# Patient Record
Sex: Female | Born: 1975 | Race: Black or African American | Hispanic: No | Marital: Single
Health system: Southern US, Community
[De-identification: ages and names within clinical notes are randomized; demographics above are authoritative.]

---

## 2004-03-27 ENCOUNTER — Emergency Department: Payer: Self-pay | Admitting: Emergency Medicine

## 2004-04-01 ENCOUNTER — Ambulatory Visit: Payer: Self-pay

## 2004-04-22 ENCOUNTER — Emergency Department: Payer: Self-pay | Admitting: Emergency Medicine

## 2004-04-29 ENCOUNTER — Other Ambulatory Visit: Payer: Self-pay

## 2004-04-29 ENCOUNTER — Inpatient Hospital Stay: Payer: Self-pay

## 2004-11-17 ENCOUNTER — Ambulatory Visit: Payer: Self-pay | Admitting: Obstetrics and Gynecology

## 2004-11-22 ENCOUNTER — Inpatient Hospital Stay: Payer: Self-pay | Admitting: Obstetrics and Gynecology

## 2005-07-30 ENCOUNTER — Emergency Department: Payer: Self-pay | Admitting: Emergency Medicine

## 2006-05-13 ENCOUNTER — Emergency Department: Payer: Self-pay | Admitting: Emergency Medicine

## 2006-06-11 ENCOUNTER — Inpatient Hospital Stay: Payer: Self-pay | Admitting: Internal Medicine

## 2006-06-11 ENCOUNTER — Other Ambulatory Visit: Payer: Self-pay

## 2006-07-19 ENCOUNTER — Emergency Department: Payer: Self-pay | Admitting: Emergency Medicine

## 2006-11-22 ENCOUNTER — Emergency Department: Payer: Self-pay | Admitting: Emergency Medicine

## 2006-11-22 ENCOUNTER — Other Ambulatory Visit: Payer: Self-pay

## 2006-12-30 ENCOUNTER — Encounter: Payer: Self-pay | Admitting: Nurse Practitioner

## 2007-01-24 ENCOUNTER — Encounter: Payer: Self-pay | Admitting: Nurse Practitioner

## 2007-06-29 ENCOUNTER — Ambulatory Visit (HOSPITAL_COMMUNITY): Admission: RE | Admit: 2007-06-29 | Discharge: 2007-06-29 | Payer: Self-pay | Admitting: Internal Medicine

## 2007-08-23 ENCOUNTER — Encounter (HOSPITAL_BASED_OUTPATIENT_CLINIC_OR_DEPARTMENT_OTHER): Payer: Self-pay | Admitting: Internal Medicine

## 2007-08-23 ENCOUNTER — Ambulatory Visit (HOSPITAL_COMMUNITY): Admission: RE | Admit: 2007-08-23 | Discharge: 2007-08-23 | Payer: Self-pay | Admitting: Internal Medicine

## 2008-08-09 ENCOUNTER — Encounter: Payer: Self-pay | Admitting: Nurse Practitioner

## 2008-08-29 ENCOUNTER — Encounter: Payer: Self-pay | Admitting: Nurse Practitioner

## 2008-09-22 ENCOUNTER — Encounter: Payer: Self-pay | Admitting: Nurse Practitioner

## 2008-10-23 ENCOUNTER — Encounter: Payer: Self-pay | Admitting: Nurse Practitioner

## 2008-11-07 ENCOUNTER — Inpatient Hospital Stay: Payer: Self-pay | Admitting: *Deleted

## 2008-12-11 ENCOUNTER — Inpatient Hospital Stay: Payer: Self-pay | Admitting: Internal Medicine

## 2009-01-16 ENCOUNTER — Ambulatory Visit: Payer: Self-pay | Admitting: Surgery

## 2009-01-23 ENCOUNTER — Ambulatory Visit: Payer: Self-pay | Admitting: Surgery

## 2009-02-11 ENCOUNTER — Inpatient Hospital Stay: Payer: Self-pay | Admitting: Internal Medicine

## 2009-03-14 ENCOUNTER — Ambulatory Visit: Payer: Self-pay | Admitting: Unknown Physician Specialty

## 2009-05-10 ENCOUNTER — Ambulatory Visit: Payer: Self-pay | Admitting: Vascular Surgery

## 2009-05-13 ENCOUNTER — Ambulatory Visit: Payer: Self-pay | Admitting: Vascular Surgery

## 2009-06-27 ENCOUNTER — Ambulatory Visit: Payer: Self-pay | Admitting: Pain Medicine

## 2009-07-02 ENCOUNTER — Ambulatory Visit: Payer: Self-pay | Admitting: Internal Medicine

## 2009-10-17 ENCOUNTER — Ambulatory Visit: Payer: Self-pay | Admitting: Vascular Surgery

## 2009-10-31 ENCOUNTER — Encounter: Payer: Self-pay | Admitting: Unknown Physician Specialty

## 2009-11-19 ENCOUNTER — Ambulatory Visit: Payer: Self-pay | Admitting: Surgery

## 2009-11-22 ENCOUNTER — Encounter: Payer: Self-pay | Admitting: Unknown Physician Specialty

## 2009-12-23 ENCOUNTER — Encounter: Payer: Self-pay | Admitting: Unknown Physician Specialty

## 2010-01-23 ENCOUNTER — Encounter: Payer: Self-pay | Admitting: Unknown Physician Specialty

## 2010-04-09 ENCOUNTER — Emergency Department: Payer: Self-pay | Admitting: Emergency Medicine

## 2010-04-14 ENCOUNTER — Inpatient Hospital Stay: Payer: Self-pay | Admitting: Internal Medicine

## 2010-04-21 ENCOUNTER — Emergency Department: Payer: Self-pay | Admitting: Unknown Physician Specialty

## 2010-04-30 ENCOUNTER — Inpatient Hospital Stay: Payer: Self-pay | Admitting: Internal Medicine

## 2010-10-17 ENCOUNTER — Inpatient Hospital Stay: Payer: Self-pay | Admitting: Internal Medicine

## 2011-04-08 ENCOUNTER — Inpatient Hospital Stay: Payer: Self-pay | Admitting: Internal Medicine

## 2011-06-13 ENCOUNTER — Emergency Department: Payer: Self-pay | Admitting: Emergency Medicine

## 2011-06-24 ENCOUNTER — Inpatient Hospital Stay: Payer: Self-pay | Admitting: Internal Medicine

## 2011-06-24 LAB — CBC
MCV: 95 fL (ref 80–100)
Platelet: 279 10*3/uL (ref 150–440)
RBC: 4.47 10*6/uL (ref 3.80–5.20)
WBC: 7.3 10*3/uL (ref 3.6–11.0)

## 2011-06-24 LAB — CK TOTAL AND CKMB (NOT AT ARMC)
CK-MB: 1.1 ng/mL (ref 0.5–3.6)
CK-MB: 1.9 ng/mL (ref 0.5–3.6)

## 2011-06-24 LAB — BASIC METABOLIC PANEL
Calcium, Total: 9.2 mg/dL (ref 8.5–10.1)
Chloride: 95 mmol/L — ABNORMAL LOW (ref 98–107)
Co2: 31 mmol/L (ref 21–32)
Creatinine: 7.88 mg/dL — ABNORMAL HIGH (ref 0.60–1.30)
EGFR (African American): 8 — ABNORMAL LOW
Sodium: 139 mmol/L (ref 136–145)

## 2011-06-24 LAB — PRO B NATRIURETIC PEPTIDE: B-Type Natriuretic Peptide: 394 pg/mL — ABNORMAL HIGH (ref 0–125)

## 2011-06-24 LAB — PHOSPHORUS: Phosphorus: 6 mg/dL — ABNORMAL HIGH (ref 2.5–4.9)

## 2011-06-24 LAB — TROPONIN I: Troponin-I: <0.02 ng/mL

## 2011-06-25 LAB — CBC WITH DIFFERENTIAL/PLATELET
Basophil #: 0 10*3/uL (ref 0.0–0.1)
Eosinophil #: 0.1 10*3/uL (ref 0.0–0.7)
Lymphocyte %: 29.3 %
Monocyte #: 0.6 10*3/uL (ref 0.0–0.7)
Monocyte %: 10 %
Neutrophil #: 3.8 10*3/uL (ref 1.4–6.5)
Platelet: 248 10*3/uL (ref 150–440)
RDW: 18.1 % — ABNORMAL HIGH (ref 11.5–14.5)
WBC: 6.4 10*3/uL (ref 3.6–11.0)

## 2011-06-25 LAB — LIPID PANEL
Ldl Cholesterol, Calc: 44 mg/dL (ref 0–100)
Triglycerides: 302 mg/dL — ABNORMAL HIGH (ref 0–200)

## 2011-06-25 LAB — BASIC METABOLIC PANEL
BUN: 27 mg/dL — ABNORMAL HIGH (ref 7–18)
Calcium, Total: 9.2 mg/dL (ref 8.5–10.1)
EGFR (African American): 9 — ABNORMAL LOW
EGFR (Non-African Amer.): 8 — ABNORMAL LOW
Glucose: 111 mg/dL — ABNORMAL HIGH (ref 65–99)
Osmolality: 274 (ref 275–301)
Sodium: 134 mmol/L — ABNORMAL LOW (ref 136–145)

## 2011-10-15 ENCOUNTER — Ambulatory Visit: Payer: Self-pay | Admitting: Pain Medicine

## 2012-02-15 ENCOUNTER — Emergency Department: Payer: Self-pay | Admitting: Emergency Medicine

## 2012-02-16 ENCOUNTER — Inpatient Hospital Stay: Payer: Self-pay | Admitting: Internal Medicine

## 2012-02-16 LAB — COMPREHENSIVE METABOLIC PANEL
Albumin: 3.2 g/dL — ABNORMAL LOW (ref 3.4–5.0)
Anion Gap: 11 (ref 7–16)
Bilirubin,Total: 0.3 mg/dL (ref 0.2–1.0)
Calcium, Total: 9 mg/dL (ref 8.5–10.1)
EGFR (Non-African Amer.): 3 — ABNORMAL LOW
Glucose: 185 mg/dL — ABNORMAL HIGH (ref 65–99)
Osmolality: 297 (ref 275–301)
Potassium: 5.1 mmol/L (ref 3.5–5.1)
Sodium: 136 mmol/L (ref 136–145)
Total Protein: 8.1 g/dL (ref 6.4–8.2)

## 2012-02-16 LAB — CBC WITH DIFFERENTIAL/PLATELET
Basophil %: 0.9 %
Eosinophil #: 0.2 10*3/uL (ref 0.0–0.7)
Eosinophil %: 2.6 %
Lymphocyte #: 1.9 10*3/uL (ref 1.0–3.6)
MCH: 31.2 pg (ref 26.0–34.0)
MCHC: 33.5 g/dL (ref 32.0–36.0)
MCV: 93 fL (ref 80–100)
Monocyte #: 0.5 x10 3/mm (ref 0.2–0.9)
Neutrophil %: 55.1 %
Platelet: 239 10*3/uL (ref 150–440)
RBC: 3.93 10*6/uL (ref 3.80–5.20)

## 2012-02-16 LAB — PHOSPHORUS: Phosphorus: 2.5 mg/dL (ref 2.5–4.9)

## 2012-04-02 IMAGING — CR DG CHEST 1V PORT
1 series · 1 of 1 positions shown · non-contrast
Comparison: none

REASON FOR EXAM: rales, dyspnea, renal failure
COMMENTS:

PROCEDURE:     DXR - DXR PORTABLE CHEST SINGLE VIEW  - April 14, 2010  [DATE]
RESULT:     The lungs are clear. The cardiac silhouette and visualized bony
skeleton are unremarkable. This is a markedly shallow inspiration.

[view not recorded]
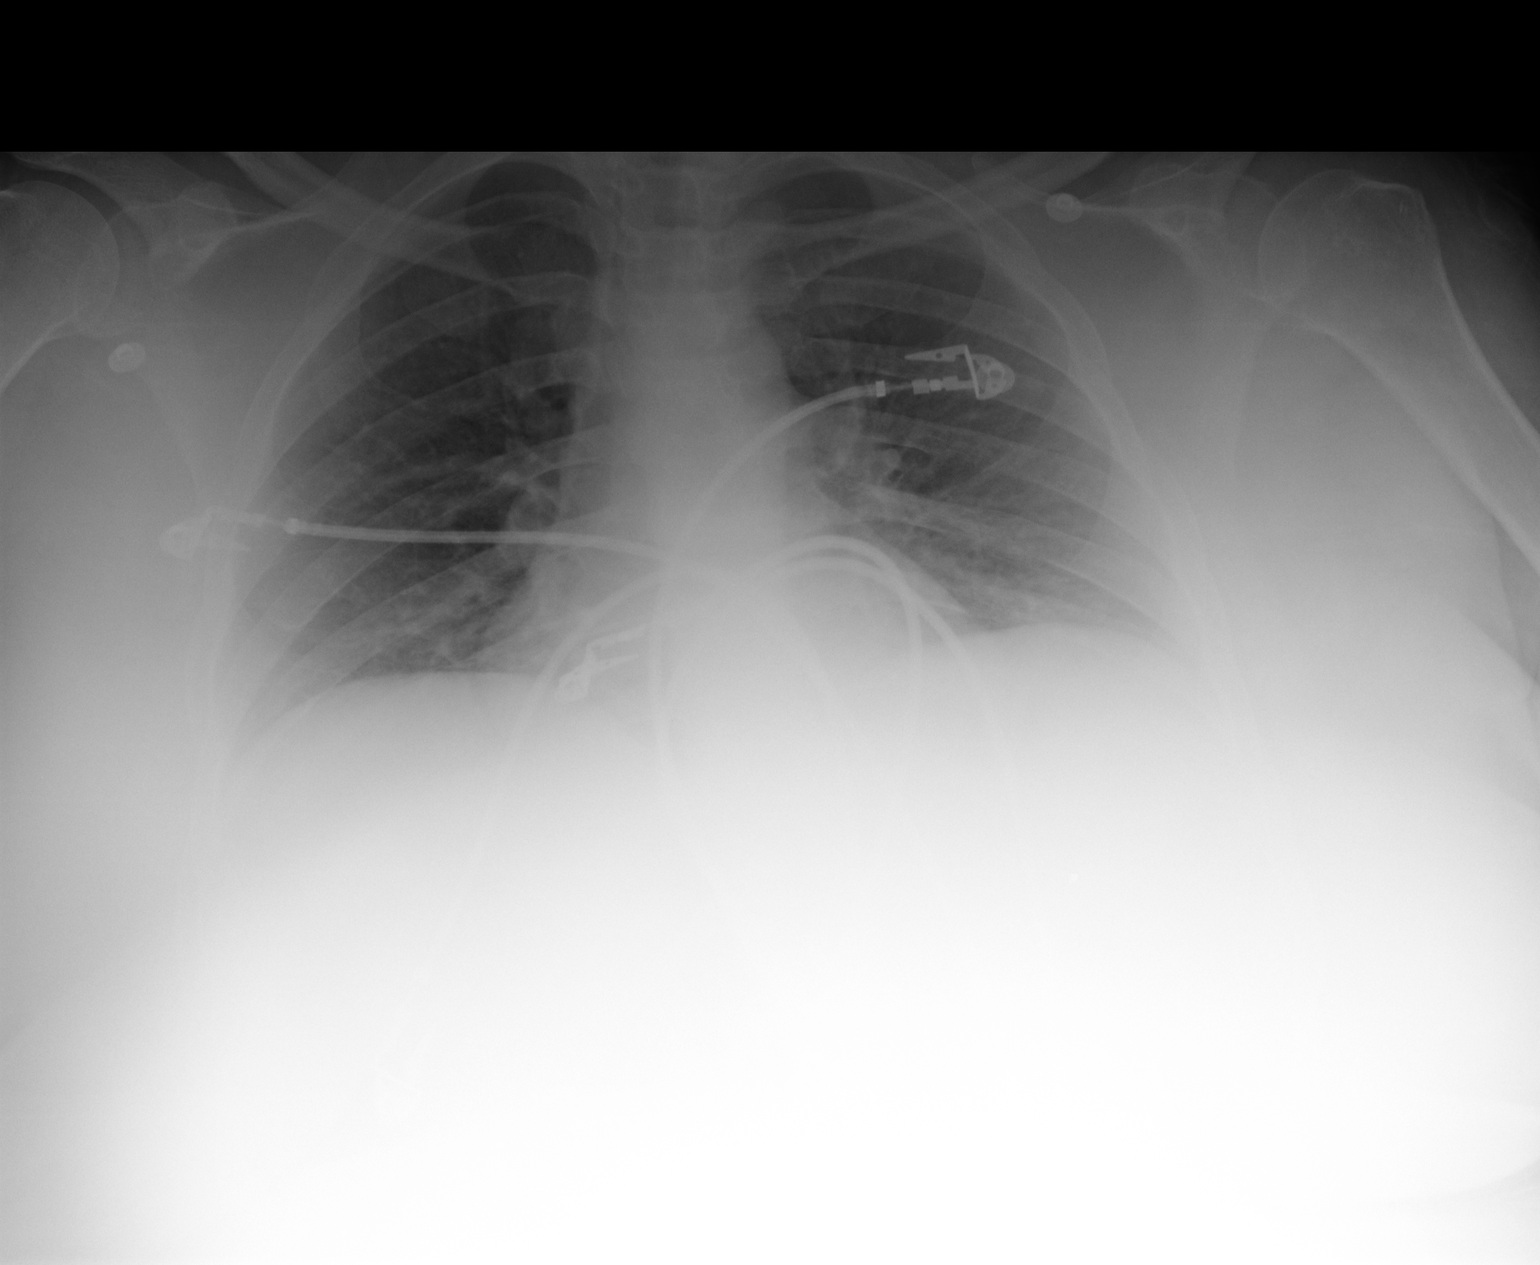

[1 of 1 positions shown; findings below may reference images not displayed]

IMPRESSION: 1. Chest radiograph without evidence of acute cardiopulmonary disease.
2. The study was compared to prior study dated 05/13/2006.

## 2012-04-03 IMAGING — US US CAROTID DUPLEX BILAT
1 series · 17 of 24 positions shown · non-contrast
Comparison: none

REASON FOR EXAM: cva
COMMENTS:

[Series 1: us carotid duplex bilat · 17 of 58 slices shown]
[im 1/58]
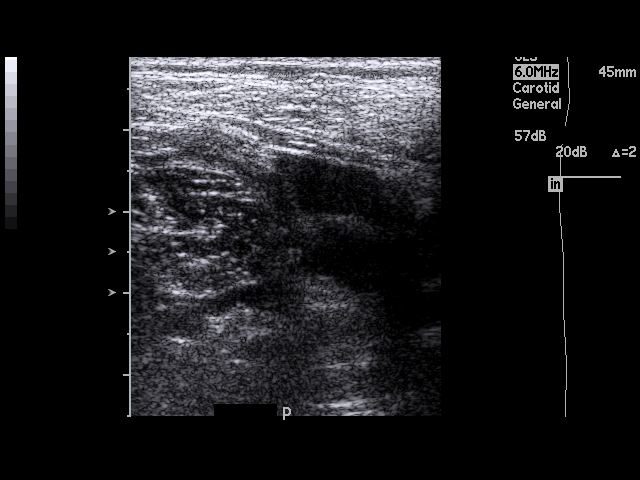
[im 5/58]
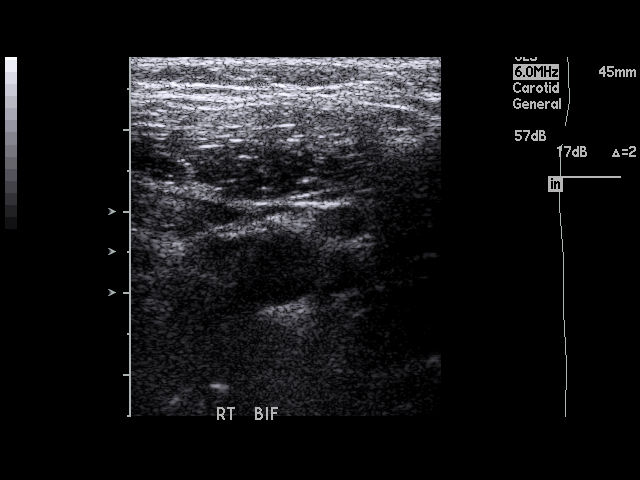
[im 8/58]
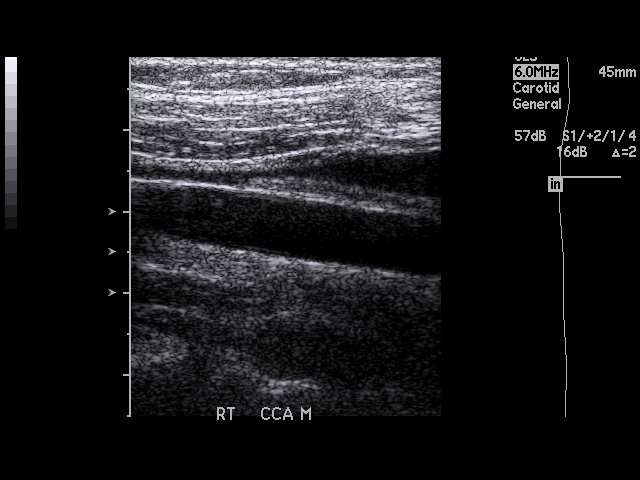
[im 10/58]
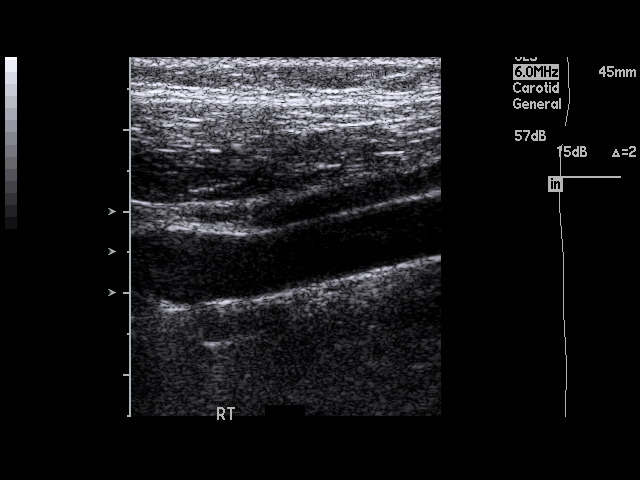
[im 15/58]
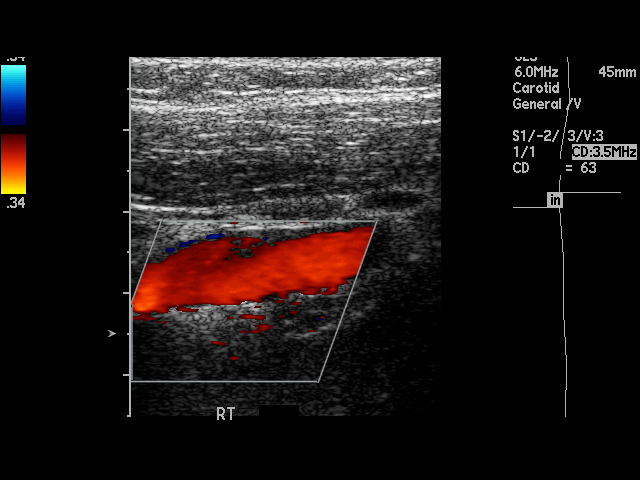
[im 18/58]
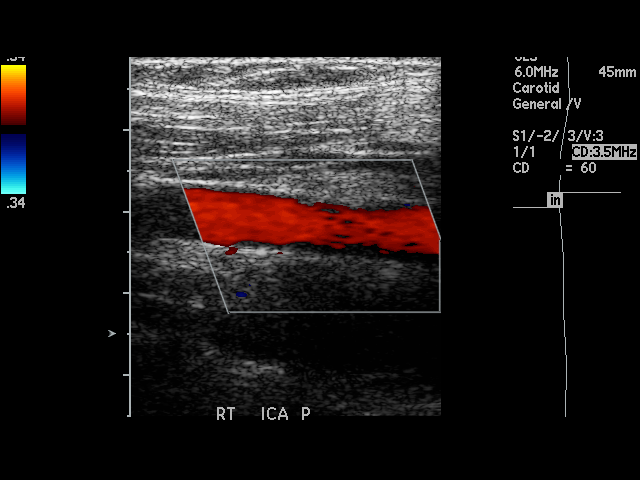
[im 23/58]
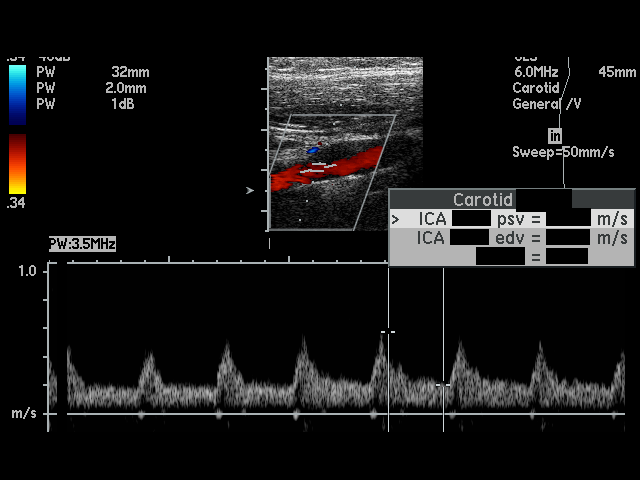
[im 25/58]
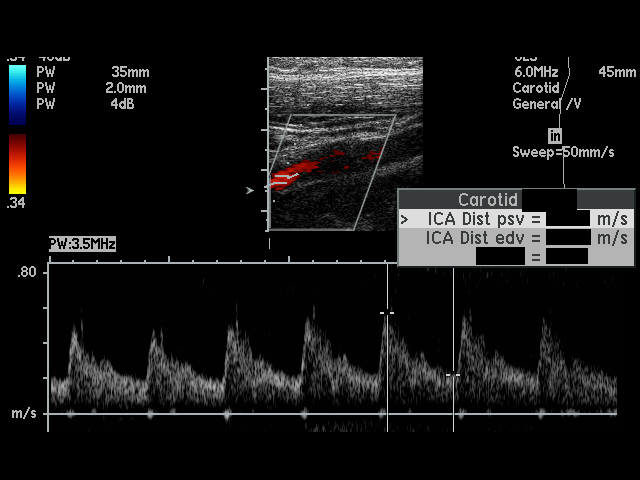
[im 30/58]
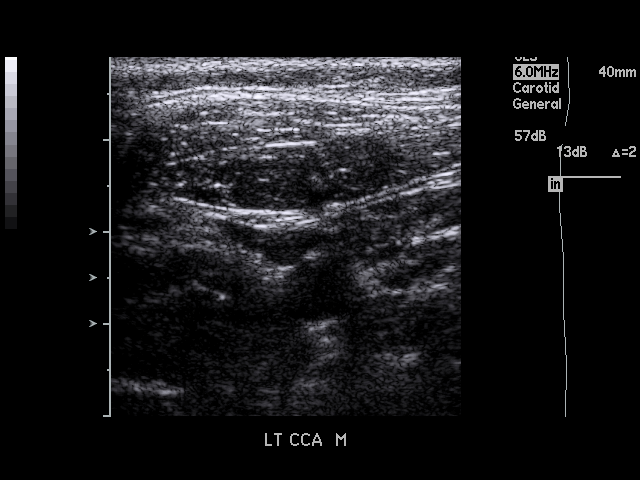
[im 33/58]
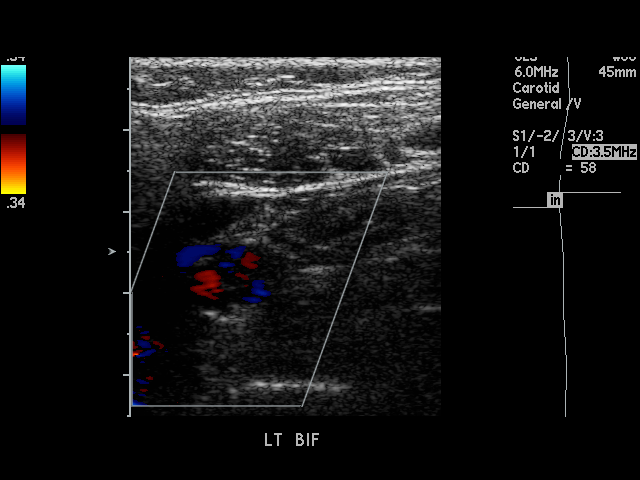
[im 35/58]
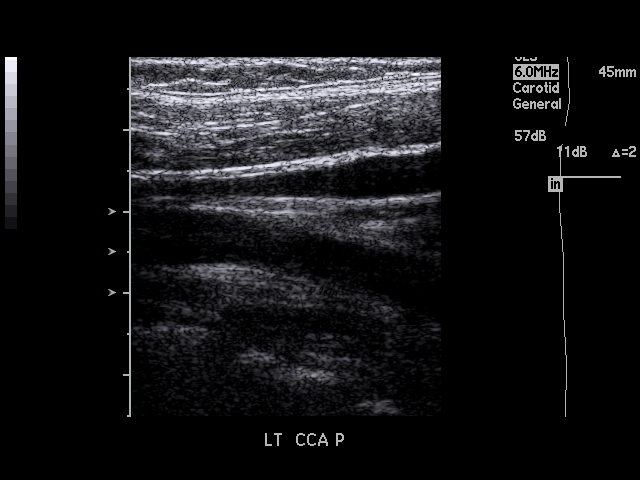
[im 40/58]
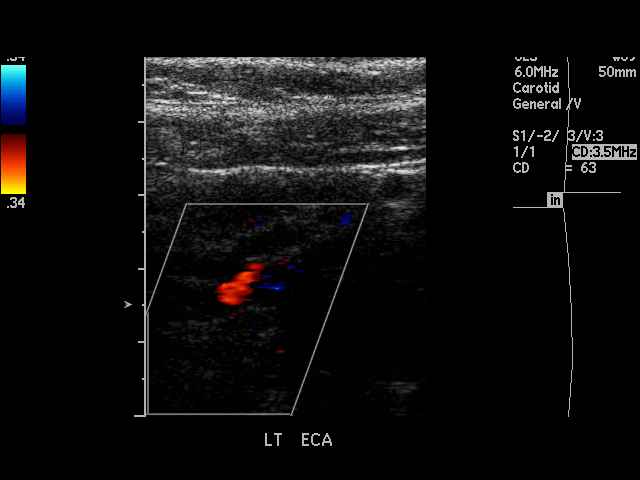
[im 43/58]
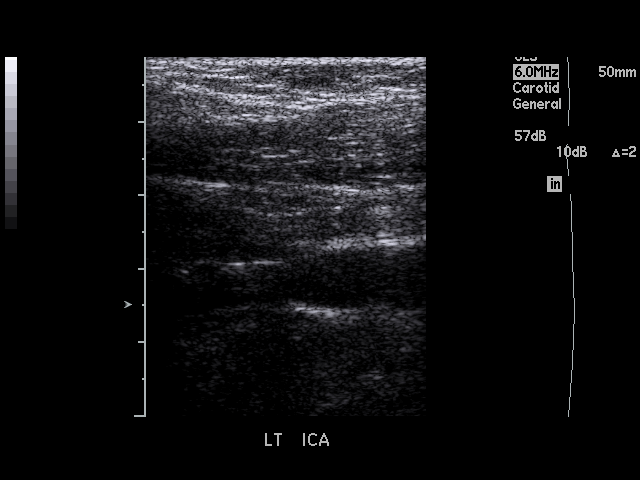
[im 48/58]
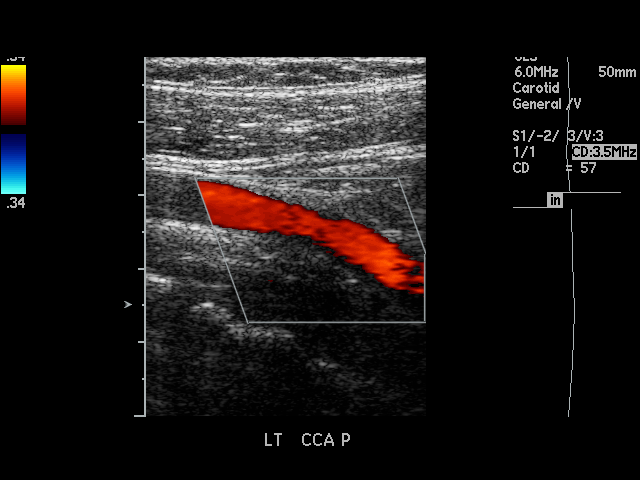
[im 50/58]
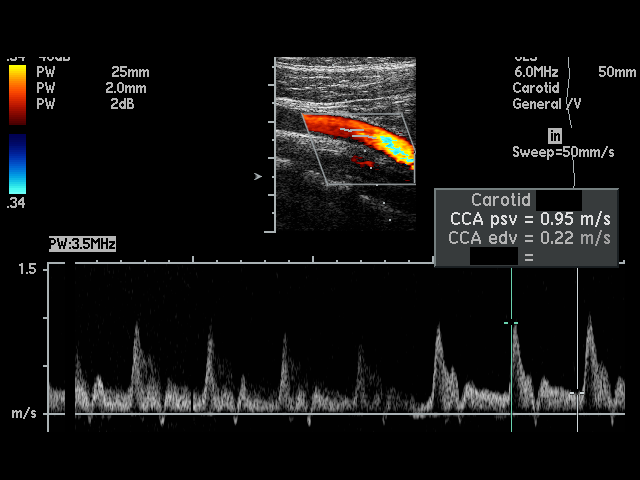
[im 53/58]
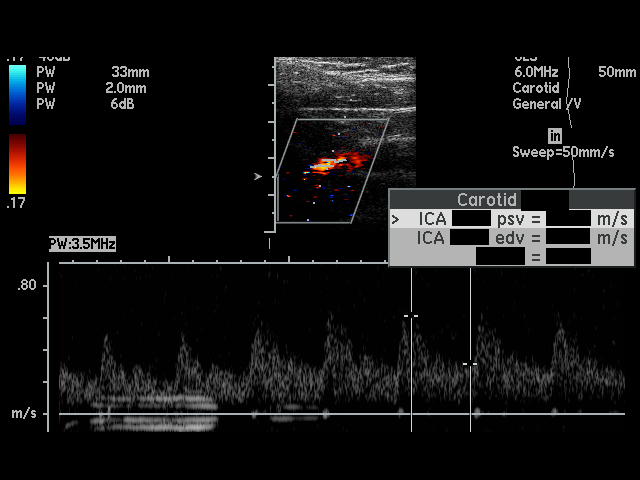
[im 58/58]
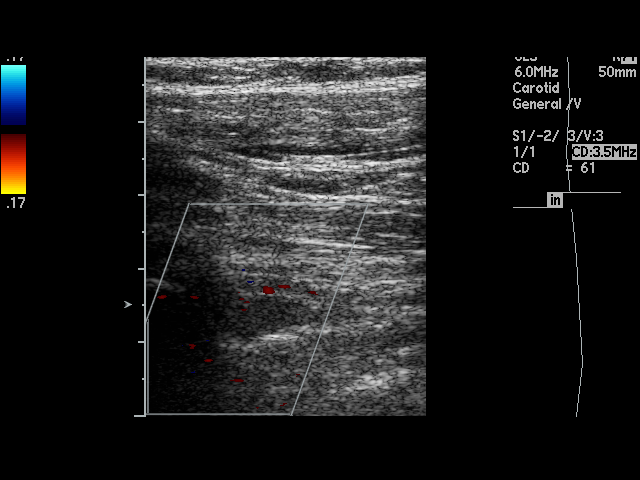

[17 of 24 positions shown; findings below may reference images not displayed]

PROCEDURE:     US  - US CAROTID DOPPLER BILATERAL  - April 15, 2010  [DATE]

RESULT:     No definite plaque formation is identified on either side. On
the right, the peak right common carotid artery flow velocity measures
meters/second and the peak right internal carotid artery flow velocity
measures 0.578 meters/second. ICA/CCA ratio is 0.781. On the left, the peak
left common carotid artery flow velocity measures 0.71 meters/second and the
peak left internal carotid artery flow velocity measures
meters/second. The ICA/CCA ratio is 0.879. These values bilaterally are
consistent with the absence of hemodynamically significant stenosis.

There is observed antegrade flow in the right vertebral. The left vertebral
is not seen and apparently is hypoplastic or occluded.
IMPRESSION: 1. No plaque formation or stenosis is identified on either side.
2. There is antegrade flow in the right vertebral.
3. The left vertebral is not seen.

## 2012-04-03 IMAGING — CR DG ABDOMEN 3V
1 series · 5 of 5 positions shown · non-contrast
Comparison: none

REASON FOR EXAM: abd discomfort
COMMENTS:

[Series 1: view not recorded · 0.17mm/px · 5 of 5 slices shown]
[im 1/5]
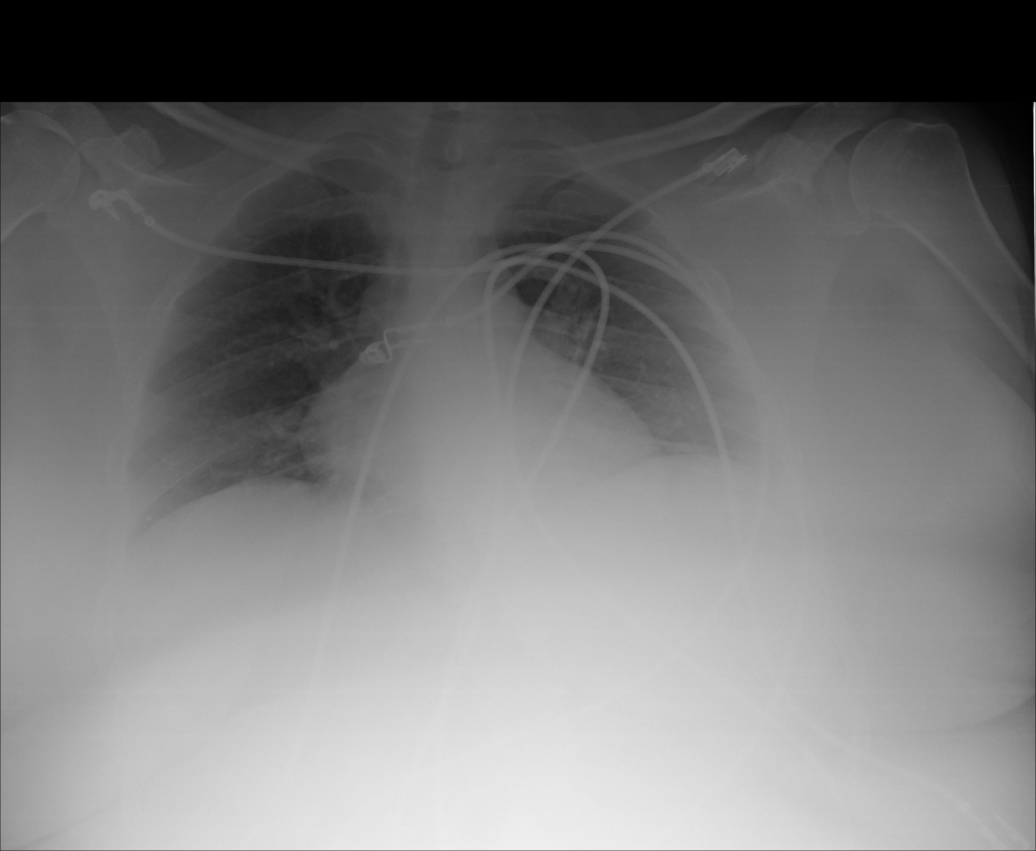
[im 2/5]
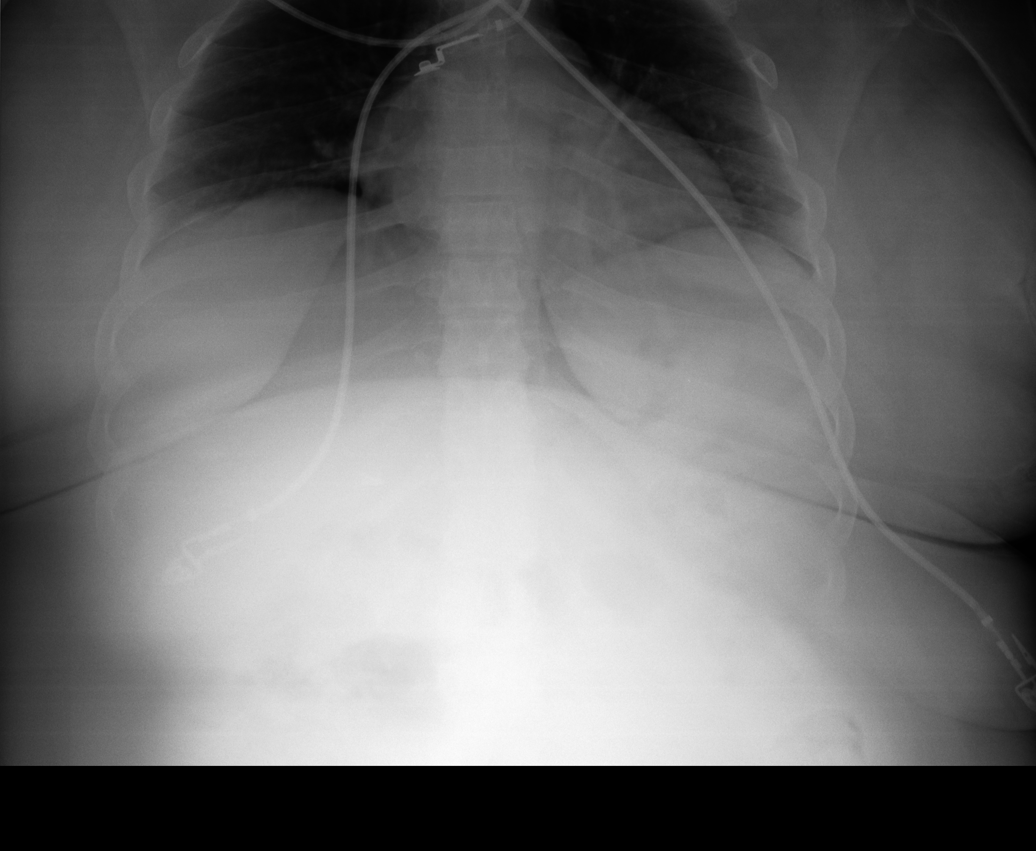
[im 3/5]
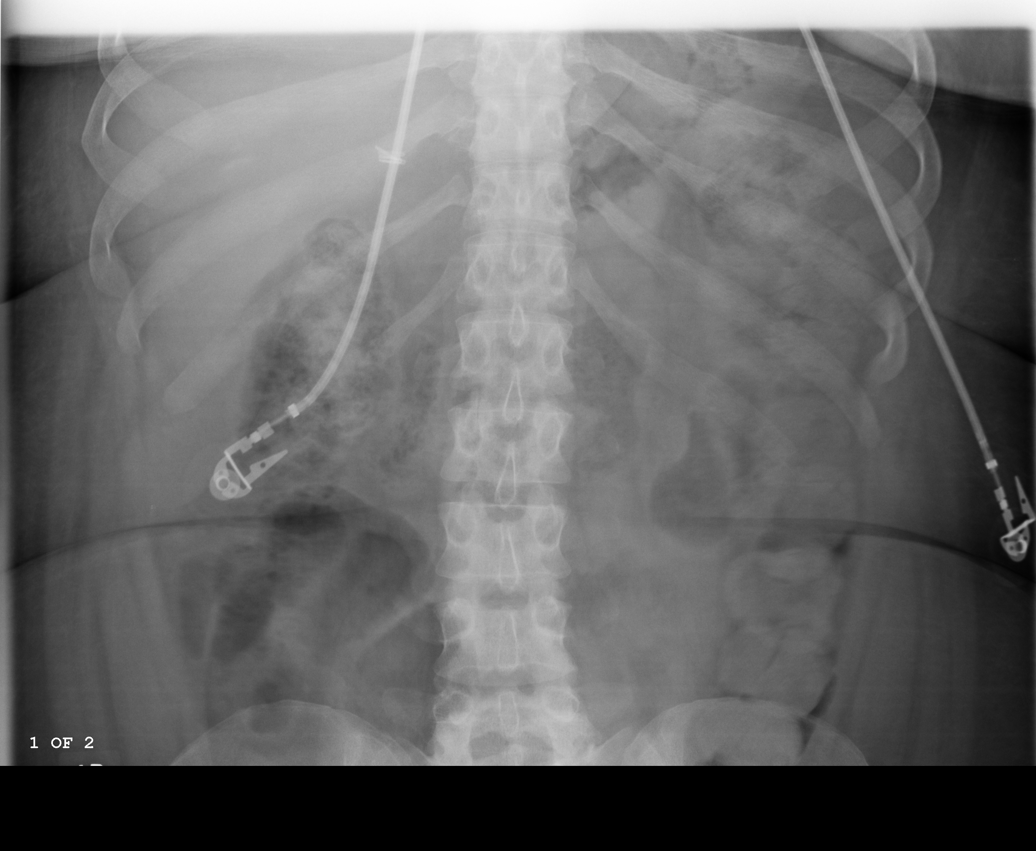
[im 4/5]
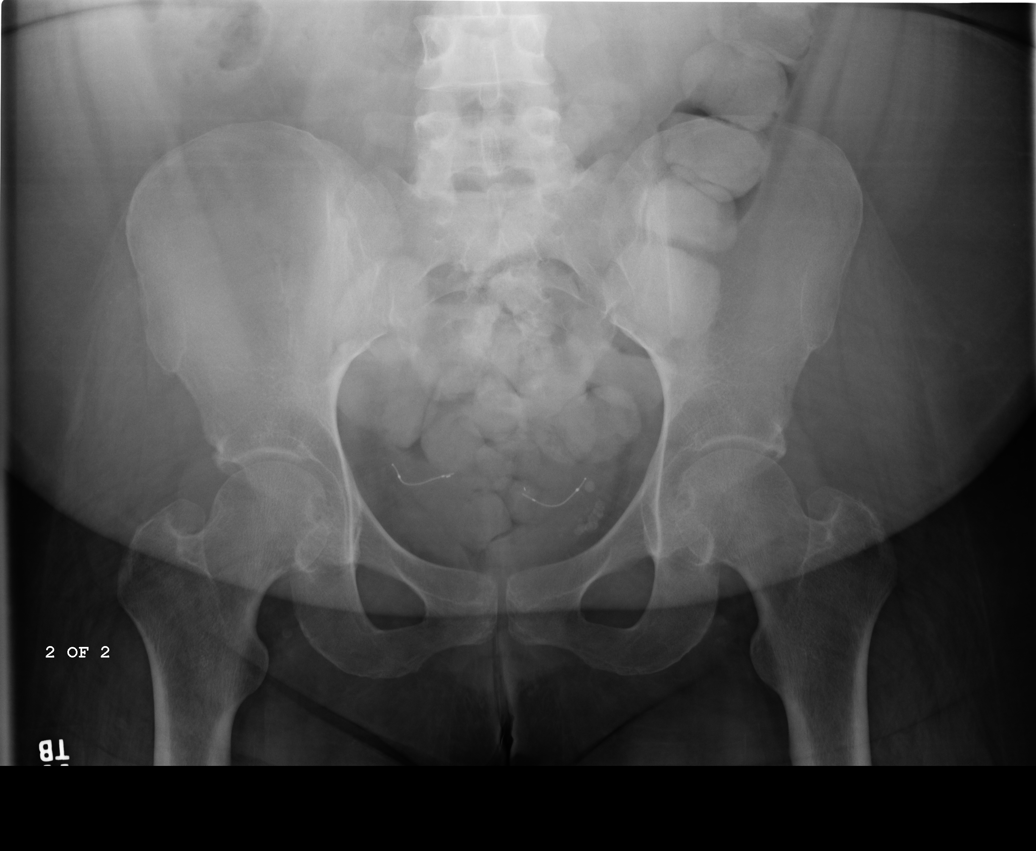
[im 5/5]
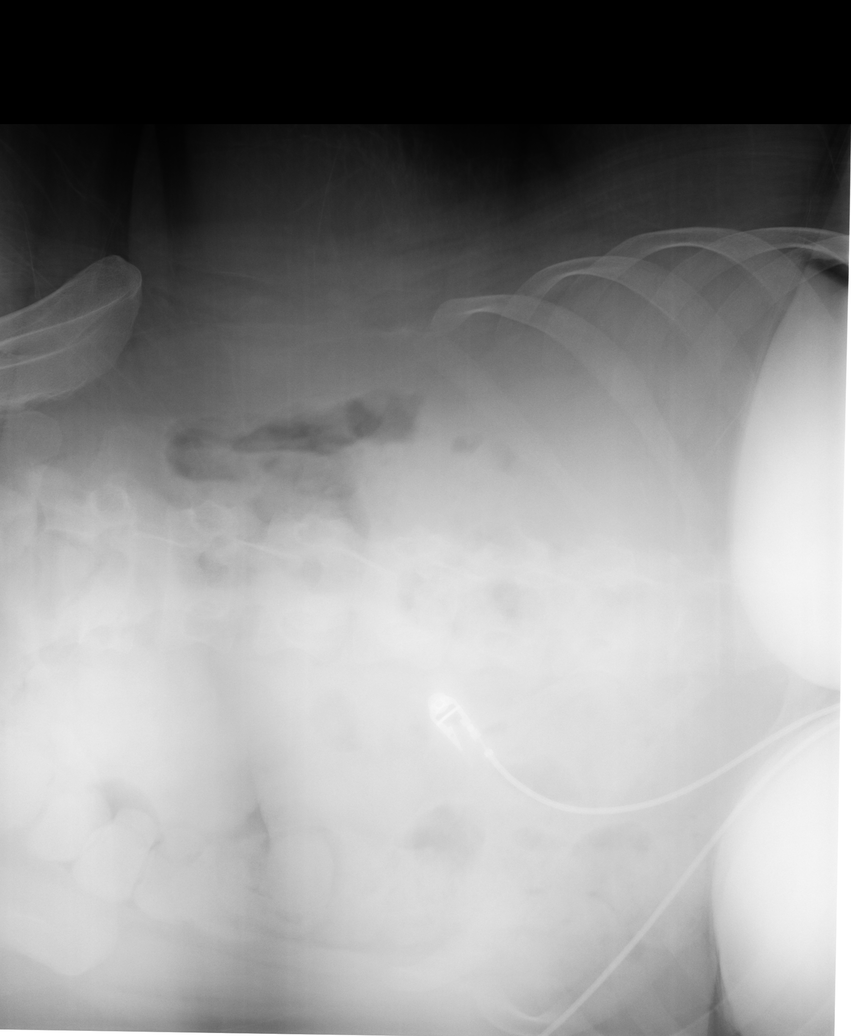

[5 of 5 positions shown; findings below may reference images not displayed]

PROCEDURE:     DXR - DXR ABDOMEN 3-WAY (INCL PA CXR)  - April 15, 2010  [DATE]

RESULT:

Comparison is made to the prior exam of 02/21/2009.

The lung fields are clear of infiltrate.

Multiple views of the abdomen were obtained. No subdiaphragmatic free air is
seen. The bowel gas pattern shows no specific abnormalities. There is no
evidence for bowel obstruction. There is a moderate amount of fecal material
in the colon. A small amount of contrast material is observed in the colon,
likely secondary to prior CT or prior GI studies. Post operative metallic
clips are noted in the region of the gallbladder bed. No acute bony
abnormalities are seen.
IMPRESSION: No acute changes are identified.

## 2012-04-09 IMAGING — CT CT ABD-PELV W/O CM
1 of 2 series · 15 of 32 positions shown, 19 images · non-contrast
Comparison: none

REASON FOR EXAM: (1) llq pain; (2) llq pain
COMMENTS:   May transport without cardiac monitor

[Series 3: stone · axial · 0.86mm/px · z∈[+258,+710]mm · 15 of 165 slices shown, 19 images]
[im 7/165  soft-tissue]
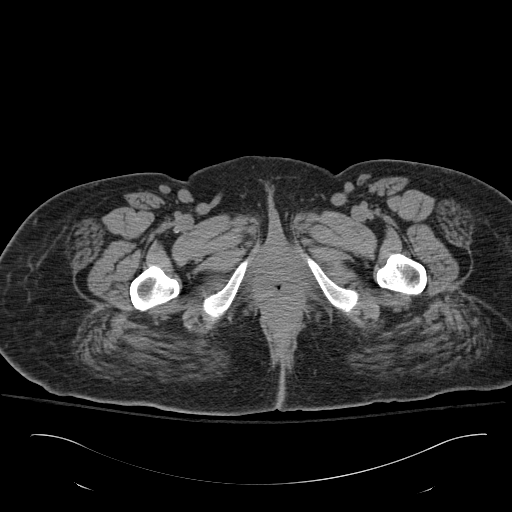
[im 7/165  bone]
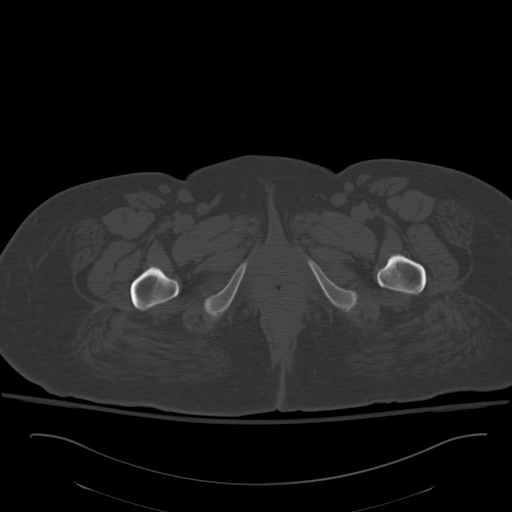
[im 19/165  soft-tissue]
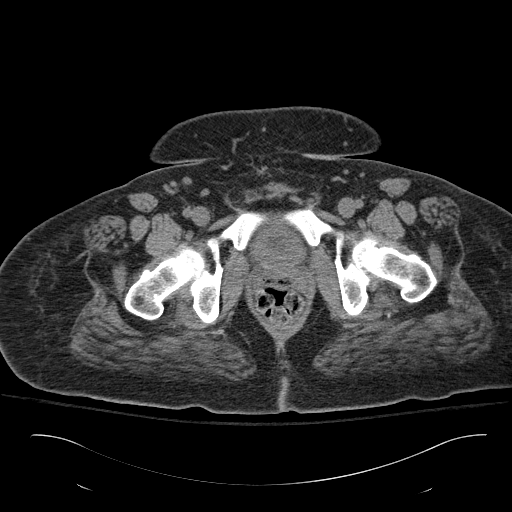
[im 32/165  soft-tissue]
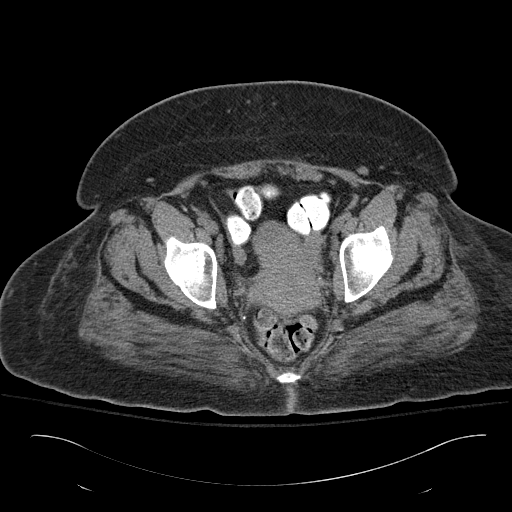
[im 45/165  soft-tissue]
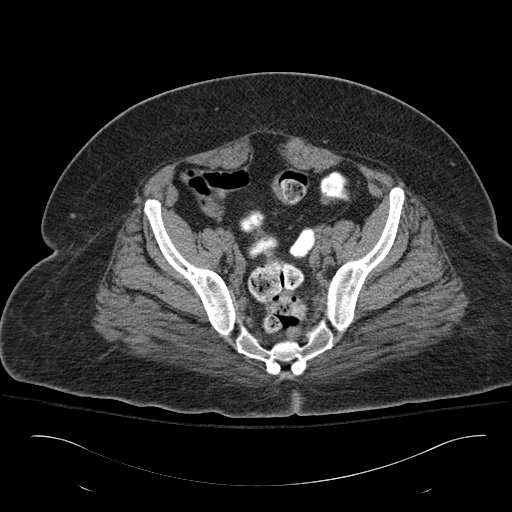
[im 57/165  soft-tissue]
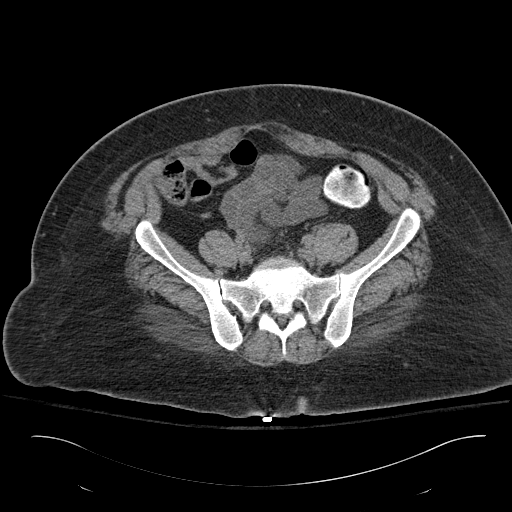
[im 70/165  soft-tissue]
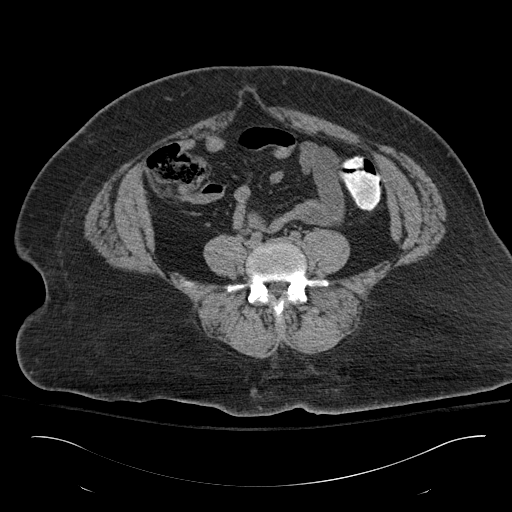
[im 83/165  soft-tissue]
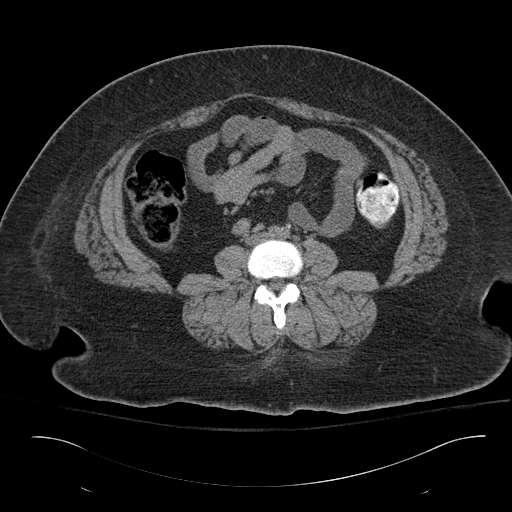
[im 95/165  soft-tissue]
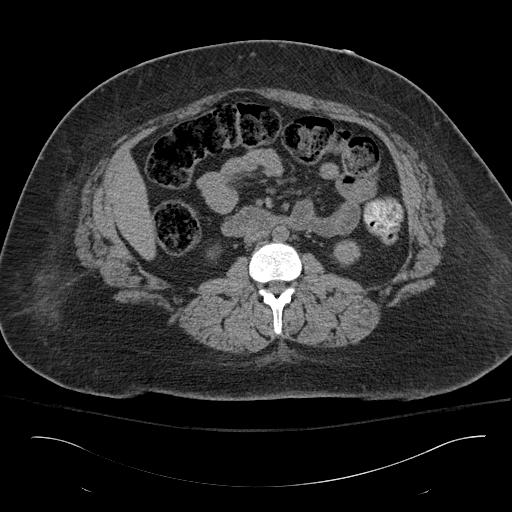
[im 108/165  soft-tissue]
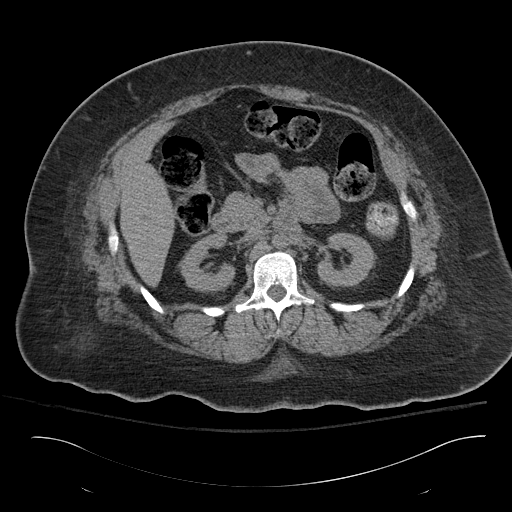
[im 108/165  bone]
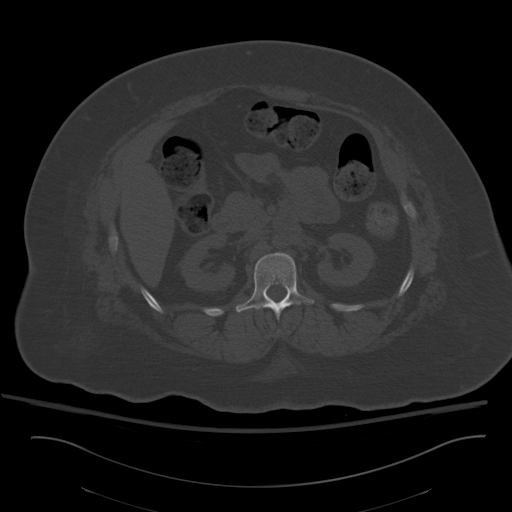
[im 120/165  soft-tissue]
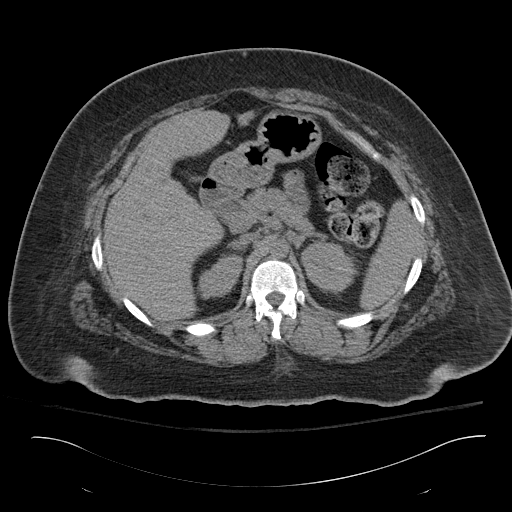
[im 133/165  soft-tissue]
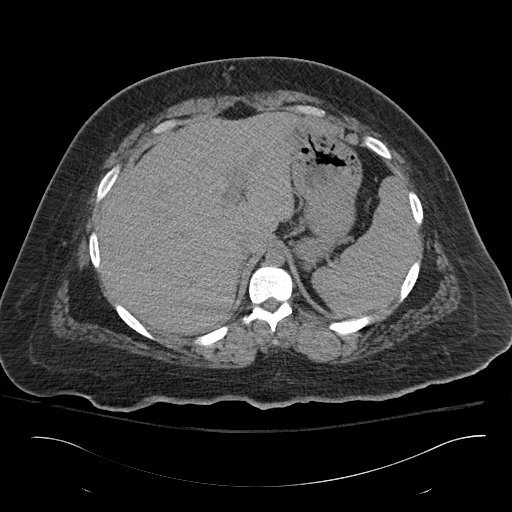
[im 139/165  lung]
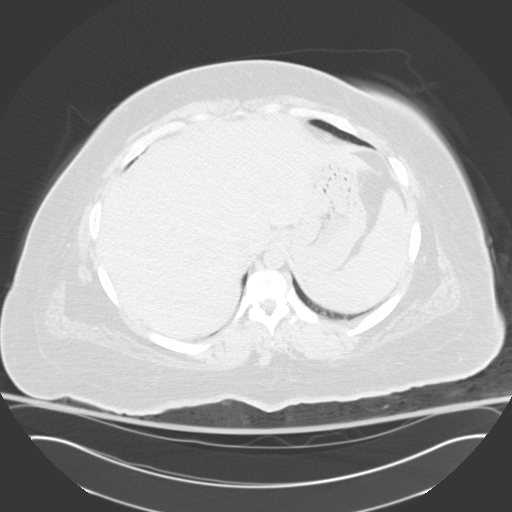
[im 146/165  soft-tissue]
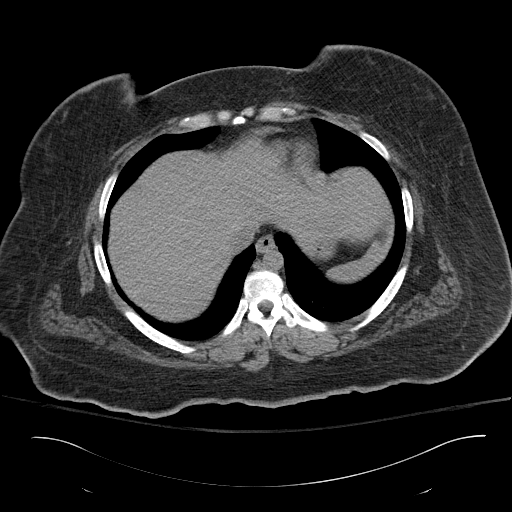
[im 146/165  lung]
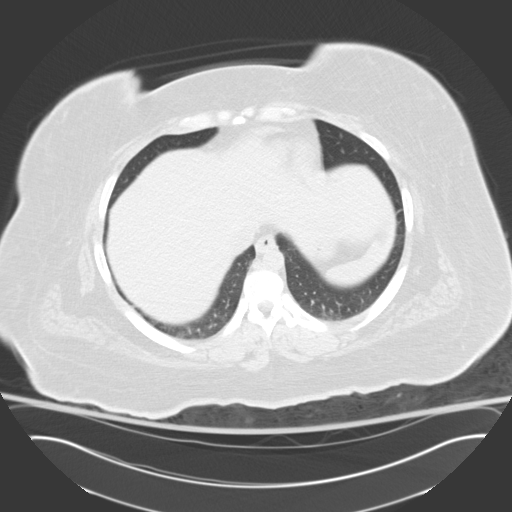
[im 152/165  lung]
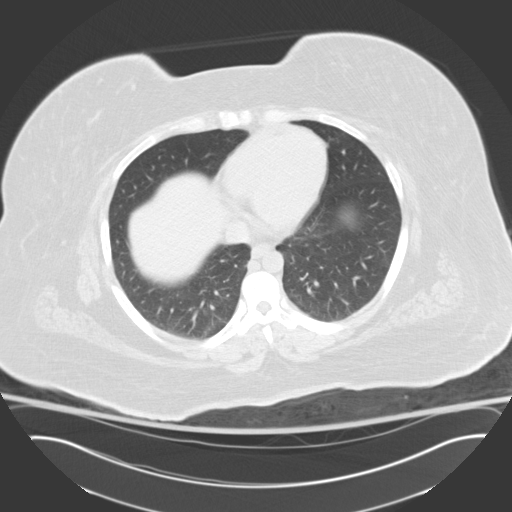
[im 158/165  soft-tissue]
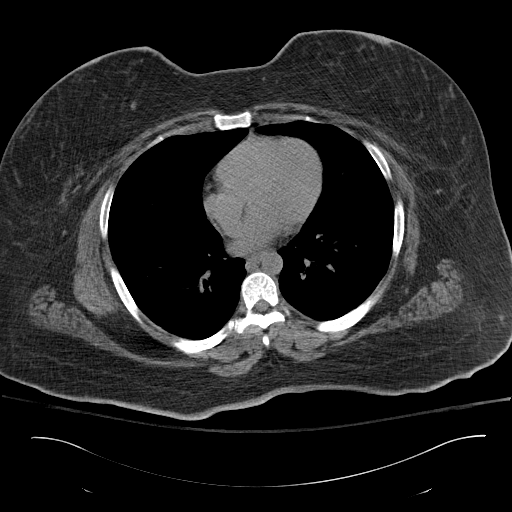
[im 158/165  lung]
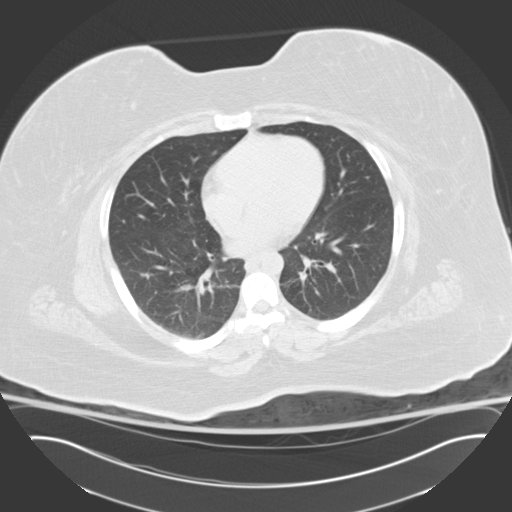

[15 of 32 positions shown; findings below may reference images not displayed]

PROCEDURE:     CT  - CT ABDOMEN AND PELVIS W[DATE]  [DATE]

RESULT:     Noncontrast CT of the abdomen and pelvis is reconstructed at 3
mm slice thickness in the axial plane and compared to the most recent study
of 12/11/2008.

The lung bases appear clear. There is no pleural or pericardial effusion.
There is no hydronephrosis or hydroureter. No radiopaque calculi are
evident. A cholecystectomy clips are present. Noncontrast images of the
liver, spleen, pancreas, kidneys, adrenal glands and abdominal aorta are
unremarkable. There is no evidence of abnormal bowel distention or bowel
wall thickening. The appendix appears unremarkable. The uterus is present.
Linear radiodensities project in the region of the uterine cornu and
fallopian tubes possibly secondary to obstructive devices. The uterus
appears unremarkable. The urinary bladder is nondistended. There is no
ascites. A small fat filled umbilical hernia is present. No abscess is
identified. Nonenlarged inguinal lymph nodes are present.
IMPRESSION: No acute abnormality evident.

## 2012-10-10 ENCOUNTER — Emergency Department: Payer: Self-pay | Admitting: Emergency Medicine

## 2012-10-10 LAB — CBC
MCH: 31.3 pg (ref 26.0–34.0)
MCV: 93 fL (ref 80–100)
WBC: 5.9 10*3/uL (ref 3.6–11.0)

## 2012-10-11 ENCOUNTER — Inpatient Hospital Stay: Payer: Self-pay | Admitting: Internal Medicine

## 2012-10-11 LAB — COMPREHENSIVE METABOLIC PANEL
BUN: 41 mg/dL — ABNORMAL HIGH (ref 7–18)
Bilirubin,Total: 0.2 mg/dL (ref 0.2–1.0)
Calcium, Total: 7.2 mg/dL — ABNORMAL LOW (ref 8.5–10.1)
Creatinine: 8.95 mg/dL — ABNORMAL HIGH (ref 0.60–1.30)
EGFR (Non-African Amer.): 5 — ABNORMAL LOW
Osmolality: 288 (ref 275–301)
SGOT(AST): 12 U/L — ABNORMAL LOW (ref 15–37)
Sodium: 132 mmol/L — ABNORMAL LOW (ref 136–145)
Total Protein: 7.5 g/dL (ref 6.4–8.2)

## 2012-10-11 LAB — CBC WITH DIFFERENTIAL/PLATELET
Basophil %: 0.8 %
Eosinophil #: 0.1 10*3/uL (ref 0.0–0.7)
HCT: 22.4 % — ABNORMAL LOW (ref 35.0–47.0)
Lymphocyte #: 1.2 10*3/uL (ref 1.0–3.6)
Lymphocyte %: 19.1 %
Monocyte #: 0.5 x10 3/mm (ref 0.2–0.9)
RDW: 16.3 % — ABNORMAL HIGH (ref 11.5–14.5)
WBC: 6.1 10*3/uL (ref 3.6–11.0)

## 2012-10-11 LAB — TROPONIN I: Troponin-I: <0.02 ng/mL

## 2012-10-12 LAB — CBC WITH DIFFERENTIAL/PLATELET
Eosinophil #: 0.1 10*3/uL (ref 0.0–0.7)
Eosinophil %: 1.8 %
HCT: 23.5 % — ABNORMAL LOW (ref 35.0–47.0)
HGB: 8 g/dL — ABNORMAL LOW (ref 12.0–16.0)
Lymphocyte %: 27.1 %
MCHC: 34 g/dL (ref 32.0–36.0)
MCV: 89 fL (ref 80–100)
Monocyte #: 0.5 x10 3/mm (ref 0.2–0.9)
Neutrophil #: 4.1 10*3/uL (ref 1.4–6.5)
Platelet: 211 10*3/uL (ref 150–440)
RBC: 2.64 10*6/uL — ABNORMAL LOW (ref 3.80–5.20)
RDW: 17.3 % — ABNORMAL HIGH (ref 11.5–14.5)

## 2012-10-12 LAB — BASIC METABOLIC PANEL
Co2: 28 mmol/L (ref 21–32)
EGFR (Non-African Amer.): 4 — ABNORMAL LOW
Potassium: 4.5 mmol/L (ref 3.5–5.1)

## 2012-10-12 LAB — PHOSPHORUS: Phosphorus: 3.6 mg/dL (ref 2.5–4.9)

## 2012-10-12 LAB — TSH: Thyroid Stimulating Horm: 1.4 u[IU]/mL

## 2012-10-15 ENCOUNTER — Inpatient Hospital Stay: Payer: Self-pay | Admitting: Internal Medicine

## 2012-10-15 LAB — COMPREHENSIVE METABOLIC PANEL
Albumin: 3.3 g/dL — ABNORMAL LOW (ref 3.4–5.0)
Anion Gap: 6 — ABNORMAL LOW (ref 7–16)
BUN: 21 mg/dL — ABNORMAL HIGH (ref 7–18)
Bilirubin,Total: 0.3 mg/dL (ref 0.2–1.0)
Calcium, Total: 7.7 mg/dL — ABNORMAL LOW (ref 8.5–10.1)
Chloride: 95 mmol/L — ABNORMAL LOW (ref 98–107)
Creatinine: 5.69 mg/dL — ABNORMAL HIGH (ref 0.60–1.30)
EGFR (Non-African Amer.): 9 — ABNORMAL LOW
Glucose: 221 mg/dL — ABNORMAL HIGH (ref 65–99)
Potassium: 3.8 mmol/L (ref 3.5–5.1)
SGOT(AST): 15 U/L (ref 15–37)
Sodium: 135 mmol/L — ABNORMAL LOW (ref 136–145)
Total Protein: 8.1 g/dL (ref 6.4–8.2)

## 2012-10-15 LAB — CBC
HCT: 18.5 % — ABNORMAL LOW (ref 35.0–47.0)
HGB: 6.4 g/dL — ABNORMAL LOW (ref 12.0–16.0)
MCH: 31.7 pg (ref 26.0–34.0)
MCV: 92 fL (ref 80–100)
RBC: 2.02 10*6/uL — ABNORMAL LOW (ref 3.80–5.20)
RDW: 17.8 % — ABNORMAL HIGH (ref 11.5–14.5)

## 2012-10-15 LAB — CK TOTAL AND CKMB (NOT AT ARMC): CK, Total: 264 U/L — ABNORMAL HIGH (ref 21–215)

## 2012-10-16 LAB — CBC WITH DIFFERENTIAL/PLATELET
Eosinophil #: 0.1 10*3/uL (ref 0.0–0.7)
Eosinophil %: 0.8 %
HCT: 22.5 % — ABNORMAL LOW (ref 35.0–47.0)
Lymphocyte #: 1.8 10*3/uL (ref 1.0–3.6)
Lymphocyte %: 21.1 %
MCH: 31.7 pg (ref 26.0–34.0)
MCHC: 34.5 g/dL (ref 32.0–36.0)
MCV: 92 fL (ref 80–100)
Monocyte #: 0.6 x10 3/mm (ref 0.2–0.9)
Monocyte %: 7.4 %
Neutrophil %: 69.9 %
Platelet: 210 10*3/uL (ref 150–440)
RDW: 16.4 % — ABNORMAL HIGH (ref 11.5–14.5)

## 2012-10-18 LAB — BASIC METABOLIC PANEL
Anion Gap: 8 (ref 7–16)
BUN: 28 mg/dL — ABNORMAL HIGH (ref 7–18)
Creatinine: 7.13 mg/dL — ABNORMAL HIGH (ref 0.60–1.30)
Osmolality: 278 (ref 275–301)
Potassium: 4.7 mmol/L (ref 3.5–5.1)

## 2012-10-18 LAB — CBC WITH DIFFERENTIAL/PLATELET
Basophil %: 0.5 %
Eosinophil %: 1.8 %
HGB: 9.9 g/dL — ABNORMAL LOW (ref 12.0–16.0)
Lymphocyte %: 21.8 %
MCHC: 34.5 g/dL (ref 32.0–36.0)
Monocyte %: 7.5 %
RBC: 3.11 10*6/uL — ABNORMAL LOW (ref 3.80–5.20)
RDW: 16.4 % — ABNORMAL HIGH (ref 11.5–14.5)
WBC: 8.1 10*3/uL (ref 3.6–11.0)

## 2012-10-20 ENCOUNTER — Inpatient Hospital Stay: Payer: Self-pay | Admitting: Internal Medicine

## 2012-10-20 LAB — URINALYSIS, COMPLETE
Nitrite: NEGATIVE
Specific Gravity: 1.015 (ref 1.003–1.030)

## 2012-10-20 LAB — COMPREHENSIVE METABOLIC PANEL
Albumin: 3.2 g/dL — ABNORMAL LOW (ref 3.4–5.0)
BUN: 32 mg/dL — ABNORMAL HIGH (ref 7–18)
Calcium, Total: 7.9 mg/dL — ABNORMAL LOW (ref 8.5–10.1)
EGFR (African American): 7 — ABNORMAL LOW
EGFR (Non-African Amer.): 6 — ABNORMAL LOW
Potassium: 4.2 mmol/L (ref 3.5–5.1)
SGPT (ALT): 22 U/L (ref 12–78)
Total Protein: 8.3 g/dL — ABNORMAL HIGH (ref 6.4–8.2)

## 2012-10-20 LAB — CBC
MCH: 32.5 pg (ref 26.0–34.0)
MCHC: 35 g/dL (ref 32.0–36.0)
MCV: 93 fL (ref 80–100)
Platelet: 242 10*3/uL (ref 150–440)
RBC: 3.25 10*6/uL — ABNORMAL LOW (ref 3.80–5.20)
WBC: 8 10*3/uL (ref 3.6–11.0)

## 2012-10-20 LAB — DRUG SCREEN, URINE
Benzodiazepine, Ur Scrn: NEGATIVE (ref ?–200)
MDMA (Ecstasy)Ur Screen: NEGATIVE (ref ?–500)

## 2012-10-21 LAB — CBC WITH DIFFERENTIAL/PLATELET
Eosinophil %: 1.3 %
Lymphocyte #: 1.1 10*3/uL (ref 1.0–3.6)
Lymphocyte %: 19.6 %
MCH: 31.2 pg (ref 26.0–34.0)
MCHC: 33.6 g/dL (ref 32.0–36.0)
Monocyte %: 7.4 %
Neutrophil #: 4 10*3/uL (ref 1.4–6.5)
RDW: 16.8 % — ABNORMAL HIGH (ref 11.5–14.5)
WBC: 5.6 10*3/uL (ref 3.6–11.0)

## 2012-10-21 LAB — BASIC METABOLIC PANEL
Anion Gap: 9 (ref 7–16)
BUN: 41 mg/dL — ABNORMAL HIGH (ref 7–18)
Calcium, Total: 7 mg/dL — CL (ref 8.5–10.1)
Chloride: 97 mmol/L — ABNORMAL LOW (ref 98–107)
Co2: 30 mmol/L (ref 21–32)
Creatinine: 9.53 mg/dL — ABNORMAL HIGH (ref 0.60–1.30)
EGFR (Non-African Amer.): 5 — ABNORMAL LOW
Osmolality: 288 (ref 275–301)
Potassium: 4.8 mmol/L (ref 3.5–5.1)
Sodium: 136 mmol/L (ref 136–145)

## 2012-10-21 LAB — CULTURE, BLOOD (SINGLE)

## 2012-10-21 LAB — PHOSPHORUS: Phosphorus: 5.1 mg/dL — ABNORMAL HIGH (ref 2.5–4.9)

## 2012-10-22 LAB — CLOSTRIDIUM DIFFICILE BY PCR

## 2012-10-22 LAB — STOOL CULTURE

## 2012-10-24 LAB — CBC WITH DIFFERENTIAL/PLATELET
Eosinophil %: 1.5 %
HCT: 24.5 % — ABNORMAL LOW (ref 35.0–47.0)
HGB: 8.1 g/dL — ABNORMAL LOW (ref 12.0–16.0)
Lymphocyte #: 1.3 10*3/uL (ref 1.0–3.6)
Lymphocyte %: 20.8 %
MCV: 92 fL (ref 80–100)
Monocyte #: 0.5 x10 3/mm (ref 0.2–0.9)
Monocyte %: 7.3 %
Neutrophil #: 4.3 10*3/uL (ref 1.4–6.5)
Neutrophil %: 69.7 %
RBC: 2.67 10*6/uL — ABNORMAL LOW (ref 3.80–5.20)
RDW: 16.5 % — ABNORMAL HIGH (ref 11.5–14.5)
WBC: 6.2 10*3/uL (ref 3.6–11.0)

## 2012-10-24 LAB — BASIC METABOLIC PANEL
Anion Gap: 9 (ref 7–16)
Calcium, Total: 7.3 mg/dL — ABNORMAL LOW (ref 8.5–10.1)
Chloride: 96 mmol/L — ABNORMAL LOW (ref 98–107)
Co2: 27 mmol/L (ref 21–32)
Creatinine: 9.89 mg/dL — ABNORMAL HIGH (ref 0.60–1.30)
EGFR (Non-African Amer.): 5 — ABNORMAL LOW
Potassium: 4.5 mmol/L (ref 3.5–5.1)
Sodium: 132 mmol/L — ABNORMAL LOW (ref 136–145)

## 2012-10-26 LAB — PHOSPHORUS: Phosphorus: 3.5 mg/dL (ref 2.5–4.9)

## 2012-10-28 LAB — CULTURE, BLOOD (SINGLE)

## 2012-11-14 ENCOUNTER — Emergency Department: Payer: Self-pay | Admitting: Emergency Medicine

## 2012-11-15 ENCOUNTER — Emergency Department: Payer: Self-pay | Admitting: Emergency Medicine

## 2012-12-20 ENCOUNTER — Encounter: Payer: Self-pay | Admitting: Family Medicine

## 2012-12-23 ENCOUNTER — Encounter: Payer: Self-pay | Admitting: Family Medicine

## 2013-01-23 ENCOUNTER — Encounter: Payer: Self-pay | Admitting: Family Medicine

## 2013-04-14 ENCOUNTER — Emergency Department: Payer: Self-pay | Admitting: Internal Medicine

## 2013-05-26 ENCOUNTER — Observation Stay: Payer: Self-pay | Admitting: Internal Medicine

## 2013-05-26 LAB — CBC
HCT: 41 % (ref 35.0–47.0)
HGB: 13.1 g/dL (ref 12.0–16.0)
MCH: 29.1 pg (ref 26.0–34.0)
MCHC: 31.8 g/dL — ABNORMAL LOW (ref 32.0–36.0)
MCV: 92 fL (ref 80–100)
Platelet: 187 10*3/uL (ref 150–440)
RBC: 4.48 10*6/uL (ref 3.80–5.20)
RDW: 16.3 % — ABNORMAL HIGH (ref 11.5–14.5)
WBC: 4.6 10*3/uL (ref 3.6–11.0)

## 2013-05-26 LAB — PHOSPHORUS: Phosphorus: 5 mg/dL — ABNORMAL HIGH (ref 2.5–4.9)

## 2013-05-26 LAB — COMPREHENSIVE METABOLIC PANEL
ALK PHOS: 417 U/L — AB
ANION GAP: 8 (ref 7–16)
Albumin: 3.2 g/dL — ABNORMAL LOW (ref 3.4–5.0)
BILIRUBIN TOTAL: 0.3 mg/dL (ref 0.2–1.0)
BUN: 59 mg/dL — ABNORMAL HIGH (ref 7–18)
CALCIUM: 7.9 mg/dL — AB (ref 8.5–10.1)
Chloride: 90 mmol/L — ABNORMAL LOW (ref 98–107)
Co2: 27 mmol/L (ref 21–32)
Creatinine: 10.76 mg/dL — ABNORMAL HIGH (ref 0.60–1.30)
EGFR (Non-African Amer.): 4 — ABNORMAL LOW
GFR CALC AF AMER: 5 — AB
Glucose: 474 mg/dL — ABNORMAL HIGH (ref 65–99)
Osmolality: 289 (ref 275–301)
POTASSIUM: 5.3 mmol/L — AB (ref 3.5–5.1)
SGOT(AST): 14 U/L — ABNORMAL LOW (ref 15–37)
SGPT (ALT): 15 U/L (ref 12–78)
Sodium: 125 mmol/L — ABNORMAL LOW (ref 136–145)
TOTAL PROTEIN: 8.5 g/dL — AB (ref 6.4–8.2)

## 2013-05-26 LAB — ETHANOL: Ethanol %: <0.003 % (ref 0.000–0.080)

## 2013-05-26 LAB — ACETAMINOPHEN LEVEL

## 2013-05-27 LAB — BASIC METABOLIC PANEL
Anion Gap: 9 (ref 7–16)
BUN: 38 mg/dL — ABNORMAL HIGH (ref 7–18)
CALCIUM: 7.9 mg/dL — AB (ref 8.5–10.1)
CREATININE: 8.37 mg/dL — AB (ref 0.60–1.30)
Chloride: 93 mmol/L — ABNORMAL LOW (ref 98–107)
Co2: 28 mmol/L (ref 21–32)
EGFR (African American): 6 — ABNORMAL LOW
GFR CALC NON AF AMER: 6 — AB
Glucose: 366 mg/dL — ABNORMAL HIGH (ref 65–99)
Osmolality: 285 (ref 275–301)
Potassium: 4.5 mmol/L (ref 3.5–5.1)
Sodium: 130 mmol/L — ABNORMAL LOW (ref 136–145)

## 2013-05-27 LAB — CBC WITH DIFFERENTIAL/PLATELET
Basophil #: 0 10*3/uL (ref 0.0–0.1)
Basophil %: 0.4 %
EOS ABS: 0 10*3/uL (ref 0.0–0.7)
Eosinophil %: 1.1 %
HCT: 37.4 % (ref 35.0–47.0)
HGB: 12.2 g/dL (ref 12.0–16.0)
LYMPHS PCT: 27.8 %
Lymphocyte #: 1.2 10*3/uL (ref 1.0–3.6)
MCH: 29.8 pg (ref 26.0–34.0)
MCHC: 32.6 g/dL (ref 32.0–36.0)
MCV: 92 fL (ref 80–100)
MONOS PCT: 8.9 %
Monocyte #: 0.4 x10 3/mm (ref 0.2–0.9)
NEUTROS ABS: 2.7 10*3/uL (ref 1.4–6.5)
Neutrophil %: 61.8 %
PLATELETS: 153 10*3/uL (ref 150–440)
RBC: 4.09 10*6/uL (ref 3.80–5.20)
RDW: 16.1 % — ABNORMAL HIGH (ref 11.5–14.5)
WBC: 4.4 10*3/uL (ref 3.6–11.0)

## 2013-06-02 ENCOUNTER — Encounter: Payer: Self-pay | Admitting: Surgery

## 2013-06-06 LAB — WOUND CULTURE

## 2013-06-07 ENCOUNTER — Emergency Department: Payer: Self-pay | Admitting: Emergency Medicine

## 2013-06-07 LAB — COMPREHENSIVE METABOLIC PANEL
ALBUMIN: 3.1 g/dL — AB (ref 3.4–5.0)
Alkaline Phosphatase: 397 U/L — ABNORMAL HIGH
Anion Gap: 7 (ref 7–16)
BILIRUBIN TOTAL: 0.3 mg/dL (ref 0.2–1.0)
BUN: 42 mg/dL — AB (ref 7–18)
CALCIUM: 8.6 mg/dL (ref 8.5–10.1)
CHLORIDE: 92 mmol/L — AB (ref 98–107)
Co2: 29 mmol/L (ref 21–32)
Creatinine: 10.08 mg/dL — ABNORMAL HIGH (ref 0.60–1.30)
EGFR (Non-African Amer.): 4 — ABNORMAL LOW
GFR CALC AF AMER: 5 — AB
GLUCOSE: 249 mg/dL — AB (ref 65–99)
Osmolality: 276 (ref 275–301)
Potassium: 5.3 mmol/L — ABNORMAL HIGH (ref 3.5–5.1)
SGOT(AST): 15 U/L (ref 15–37)
SGPT (ALT): 19 U/L (ref 12–78)
Sodium: 128 mmol/L — ABNORMAL LOW (ref 136–145)
Total Protein: 8.3 g/dL — ABNORMAL HIGH (ref 6.4–8.2)

## 2013-06-07 LAB — CBC
HCT: 41 % (ref 35.0–47.0)
HGB: 13.6 g/dL (ref 12.0–16.0)
MCH: 30.5 pg (ref 26.0–34.0)
MCHC: 33.2 g/dL (ref 32.0–36.0)
MCV: 92 fL (ref 80–100)
Platelet: 190 10*3/uL (ref 150–440)
RBC: 4.48 10*6/uL (ref 3.80–5.20)
RDW: 16.1 % — AB (ref 11.5–14.5)
WBC: 5.8 10*3/uL (ref 3.6–11.0)

## 2013-06-25 ENCOUNTER — Encounter: Payer: Self-pay | Admitting: Surgery

## 2013-07-01 ENCOUNTER — Inpatient Hospital Stay: Payer: Self-pay | Admitting: Internal Medicine

## 2013-07-01 LAB — CBC WITH DIFFERENTIAL/PLATELET
BASOS PCT: 1 %
Basophil #: 0.1 10*3/uL (ref 0.0–0.1)
Eosinophil #: 0.1 10*3/uL (ref 0.0–0.7)
Eosinophil %: 1.5 %
HCT: 38.2 % (ref 35.0–47.0)
HGB: 12.5 g/dL (ref 12.0–16.0)
Lymphocyte #: 1.1 10*3/uL (ref 1.0–3.6)
Lymphocyte %: 20.4 %
MCH: 30 pg (ref 26.0–34.0)
MCHC: 32.8 g/dL (ref 32.0–36.0)
MCV: 91 fL (ref 80–100)
Monocyte #: 0.5 x10 3/mm (ref 0.2–0.9)
Monocyte %: 9.3 %
NEUTROS ABS: 3.7 10*3/uL (ref 1.4–6.5)
NEUTROS PCT: 67.8 %
PLATELETS: 183 10*3/uL (ref 150–440)
RBC: 4.19 10*6/uL (ref 3.80–5.20)
RDW: 15.3 % — ABNORMAL HIGH (ref 11.5–14.5)
WBC: 5.4 10*3/uL (ref 3.6–11.0)

## 2013-07-01 LAB — COMPREHENSIVE METABOLIC PANEL
ALBUMIN: 2.9 g/dL — AB (ref 3.4–5.0)
AST: 21 U/L (ref 15–37)
Alkaline Phosphatase: 389 U/L — ABNORMAL HIGH
Anion Gap: 8 (ref 7–16)
BUN: 43 mg/dL — ABNORMAL HIGH (ref 7–18)
Bilirubin,Total: 0.4 mg/dL (ref 0.2–1.0)
CALCIUM: 8.1 mg/dL — AB (ref 8.5–10.1)
CO2: 27 mmol/L (ref 21–32)
Chloride: 91 mmol/L — ABNORMAL LOW (ref 98–107)
Creatinine: 10.55 mg/dL — ABNORMAL HIGH (ref 0.60–1.30)
EGFR (African American): 5 — ABNORMAL LOW
GFR CALC NON AF AMER: 4 — AB
GLUCOSE: 307 mg/dL — AB (ref 65–99)
Osmolality: 276 (ref 275–301)
POTASSIUM: 5.1 mmol/L (ref 3.5–5.1)
SGPT (ALT): 12 U/L (ref 12–78)
Sodium: 126 mmol/L — ABNORMAL LOW (ref 136–145)
Total Protein: 8.3 g/dL — ABNORMAL HIGH (ref 6.4–8.2)

## 2013-07-02 LAB — COMPREHENSIVE METABOLIC PANEL
ALBUMIN: 2.6 g/dL — AB (ref 3.4–5.0)
ANION GAP: 7 (ref 7–16)
AST: 10 U/L — AB (ref 15–37)
Alkaline Phosphatase: 379 U/L — ABNORMAL HIGH
BUN: 52 mg/dL — ABNORMAL HIGH (ref 7–18)
Bilirubin,Total: 0.4 mg/dL (ref 0.2–1.0)
CREATININE: 11.78 mg/dL — AB (ref 0.60–1.30)
Calcium, Total: 7.2 mg/dL — ABNORMAL LOW (ref 8.5–10.1)
Chloride: 91 mmol/L — ABNORMAL LOW (ref 98–107)
Co2: 30 mmol/L (ref 21–32)
EGFR (African American): 4 — ABNORMAL LOW
EGFR (Non-African Amer.): 4 — ABNORMAL LOW
Glucose: 363 mg/dL — ABNORMAL HIGH (ref 65–99)
Osmolality: 290 (ref 275–301)
POTASSIUM: 5.2 mmol/L — AB (ref 3.5–5.1)
SGPT (ALT): 15 U/L (ref 12–78)
Sodium: 128 mmol/L — ABNORMAL LOW (ref 136–145)
Total Protein: 7.1 g/dL (ref 6.4–8.2)

## 2013-07-02 LAB — CBC WITH DIFFERENTIAL/PLATELET
Basophil #: 0 10*3/uL (ref 0.0–0.1)
Basophil %: 0.7 %
EOS PCT: 1.6 %
Eosinophil #: 0.1 10*3/uL (ref 0.0–0.7)
HCT: 37.2 % (ref 35.0–47.0)
HGB: 12.6 g/dL (ref 12.0–16.0)
Lymphocyte #: 1.5 10*3/uL (ref 1.0–3.6)
Lymphocyte %: 31.1 %
MCH: 30.9 pg (ref 26.0–34.0)
MCHC: 33.7 g/dL (ref 32.0–36.0)
MCV: 92 fL (ref 80–100)
Monocyte #: 0.4 x10 3/mm (ref 0.2–0.9)
Monocyte %: 7.4 %
NEUTROS PCT: 59.2 %
Neutrophil #: 2.9 10*3/uL (ref 1.4–6.5)
Platelet: 197 10*3/uL (ref 150–440)
RBC: 4.06 10*6/uL (ref 3.80–5.20)
RDW: 15.8 % — ABNORMAL HIGH (ref 11.5–14.5)
WBC: 4.9 10*3/uL (ref 3.6–11.0)

## 2013-07-02 LAB — PHOSPHORUS: Phosphorus: 4.7 mg/dL (ref 2.5–4.9)

## 2013-07-03 LAB — PHOSPHORUS: Phosphorus: 5.1 mg/dL — ABNORMAL HIGH (ref 2.5–4.9)

## 2013-07-05 LAB — HEMOGLOBIN: HGB: 11.7 g/dL — ABNORMAL LOW (ref 12.0–16.0)

## 2013-07-05 LAB — PHOSPHORUS: Phosphorus: 5.8 mg/dL — ABNORMAL HIGH (ref 2.5–4.9)

## 2013-07-05 LAB — POTASSIUM: Potassium: 4.9 mmol/L (ref 3.5–5.1)

## 2013-08-12 ENCOUNTER — Ambulatory Visit
Admit: 2013-08-12 | Disposition: A | Discharge status: Home / Self Care | Payer: Self-pay | Admitting: Emergency Medicine

## 2013-08-12 ENCOUNTER — Inpatient Hospital Stay: Payer: Self-pay | Admitting: Specialist

## 2013-08-12 LAB — COMPREHENSIVE METABOLIC PANEL
ALBUMIN: 3.3 g/dL — AB (ref 3.4–5.0)
ALK PHOS: 351 U/L — AB
ALK PHOS: 343 U/L — AB
ALT: 14 U/L (ref 12–78)
ANION GAP: 5 — AB (ref 7–16)
Albumin: 3.4 g/dL (ref 3.4–5.0)
Anion Gap: 7 (ref 7–16)
BILIRUBIN TOTAL: 0.4 mg/dL (ref 0.2–1.0)
BILIRUBIN TOTAL: 0.4 mg/dL (ref 0.2–1.0)
BUN: 20 mg/dL — ABNORMAL HIGH (ref 7–18)
BUN: 21 mg/dL — AB (ref 7–18)
CHLORIDE: 91 mmol/L — AB (ref 98–107)
Calcium, Total: 8.7 mg/dL (ref 8.5–10.1)
Calcium, Total: 8.6 mg/dL (ref 8.5–10.1)
Chloride: 91 mmol/L — ABNORMAL LOW (ref 98–107)
Co2: 34 mmol/L — ABNORMAL HIGH (ref 21–32)
Co2: 31 mmol/L (ref 21–32)
Creatinine: 5.38 mg/dL — ABNORMAL HIGH (ref 0.60–1.30)
Creatinine: 5.48 mg/dL — ABNORMAL HIGH (ref 0.60–1.30)
EGFR (African American): 11 — ABNORMAL LOW
EGFR (African American): 11 — ABNORMAL LOW
GFR CALC NON AF AMER: 9 — AB
GFR CALC NON AF AMER: 9 — AB
Glucose: 278 mg/dL — ABNORMAL HIGH (ref 65–99)
Glucose: 298 mg/dL — ABNORMAL HIGH (ref 65–99)
OSMOLALITY: 273 (ref 275–301)
OSMOLALITY: 273 (ref 275–301)
POTASSIUM: 4.4 mmol/L (ref 3.5–5.1)
POTASSIUM: 4.7 mmol/L (ref 3.5–5.1)
SGOT(AST): 17 U/L (ref 15–37)
SGOT(AST): 12 U/L — ABNORMAL LOW (ref 15–37)
SGPT (ALT): 14 U/L (ref 12–78)
SODIUM: 130 mmol/L — AB (ref 136–145)
SODIUM: 129 mmol/L — AB (ref 136–145)
TOTAL PROTEIN: 9.4 g/dL — AB (ref 6.4–8.2)
Total Protein: 9.7 g/dL — ABNORMAL HIGH (ref 6.4–8.2)

## 2013-08-12 LAB — CBC WITH DIFFERENTIAL/PLATELET
BASOS ABS: 0.1 10*3/uL (ref 0.0–0.1)
BASOS PCT: 0.9 %
Basophil #: 0.1 10*3/uL (ref 0.0–0.1)
Basophil %: 1.1 %
EOS ABS: 0 10*3/uL (ref 0.0–0.7)
EOS PCT: 0.2 %
Eosinophil #: 0 10*3/uL (ref 0.0–0.7)
Eosinophil %: 0.3 %
HCT: 43.6 % (ref 35.0–47.0)
HCT: 41.8 % (ref 35.0–47.0)
HGB: 13.2 g/dL (ref 12.0–16.0)
HGB: 14 g/dL (ref 12.0–16.0)
LYMPHS ABS: 0.9 10*3/uL — AB (ref 1.0–3.6)
Lymphocyte #: 1 10*3/uL (ref 1.0–3.6)
Lymphocyte %: 13.3 %
Lymphocyte %: 12.4 %
MCH: 30.2 pg (ref 26.0–34.0)
MCH: 29.8 pg (ref 26.0–34.0)
MCHC: 32.2 g/dL (ref 32.0–36.0)
MCHC: 31.7 g/dL — ABNORMAL LOW (ref 32.0–36.0)
MCV: 94 fL (ref 80–100)
MCV: 94 fL (ref 80–100)
MONOS PCT: 9.4 %
Monocyte #: 0.7 x10 3/mm (ref 0.2–0.9)
Monocyte #: 0.6 x10 3/mm (ref 0.2–0.9)
Monocyte %: 8.4 %
NEUTROS ABS: 5.8 10*3/uL (ref 1.4–6.5)
NEUTROS ABS: 5.9 10*3/uL (ref 1.4–6.5)
NEUTROS PCT: 77.9 %
Neutrophil %: 76.1 %
PLATELETS: 221 10*3/uL (ref 150–440)
PLATELETS: 209 10*3/uL (ref 150–440)
RBC: 4.44 10*6/uL (ref 3.80–5.20)
RBC: 4.65 10*6/uL (ref 3.80–5.20)
RDW: 17.9 % — ABNORMAL HIGH (ref 11.5–14.5)
RDW: 18.2 % — AB (ref 11.5–14.5)
WBC: 7.6 10*3/uL (ref 3.6–11.0)
WBC: 7.6 10*3/uL (ref 3.6–11.0)

## 2013-08-12 LAB — TROPONIN I: Troponin-I: <0.02 ng/mL

## 2013-08-12 LAB — AMMONIA: Ammonia, Plasma: 19 mcmol/L (ref 11–32)

## 2013-08-13 ENCOUNTER — Ambulatory Visit: Payer: Self-pay

## 2013-08-13 LAB — CBC WITH DIFFERENTIAL/PLATELET
BASOS ABS: 0 10*3/uL (ref 0.0–0.1)
Basophil #: 0 10*3/uL (ref 0.0–0.1)
Basophil %: 0.7 %
Basophil %: 0.7 %
EOS ABS: 0 10*3/uL (ref 0.0–0.7)
EOS PCT: 0.1 %
Eosinophil #: 0 10*3/uL (ref 0.0–0.7)
Eosinophil %: 0.5 %
HCT: 42.4 % (ref 35.0–47.0)
HCT: 41.6 % (ref 35.0–47.0)
HGB: 13.6 g/dL (ref 12.0–16.0)
HGB: 13.2 g/dL (ref 12.0–16.0)
LYMPHS PCT: 14 %
LYMPHS PCT: 23.7 %
Lymphocyte #: 0.9 10*3/uL — ABNORMAL LOW (ref 1.0–3.6)
Lymphocyte #: 1.4 10*3/uL (ref 1.0–3.6)
MCH: 30.2 pg (ref 26.0–34.0)
MCH: 29.6 pg (ref 26.0–34.0)
MCHC: 32.1 g/dL (ref 32.0–36.0)
MCHC: 31.8 g/dL — ABNORMAL LOW (ref 32.0–36.0)
MCV: 94 fL (ref 80–100)
MCV: 93 fL (ref 80–100)
MONO ABS: 0.4 x10 3/mm (ref 0.2–0.9)
MONO ABS: 0.7 x10 3/mm (ref 0.2–0.9)
Monocyte %: 6.6 %
Monocyte %: 11.3 %
NEUTROS ABS: 5.3 10*3/uL (ref 1.4–6.5)
Neutrophil #: 3.7 10*3/uL (ref 1.4–6.5)
Neutrophil %: 78.6 %
Neutrophil %: 63.8 %
PLATELETS: 232 10*3/uL (ref 150–440)
Platelet: 204 10*3/uL (ref 150–440)
RBC: 4.5 10*6/uL (ref 3.80–5.20)
RBC: 4.47 10*6/uL (ref 3.80–5.20)
RDW: 18 % — AB (ref 11.5–14.5)
RDW: 17.9 % — ABNORMAL HIGH (ref 11.5–14.5)
WBC: 6.7 10*3/uL (ref 3.6–11.0)
WBC: 5.9 10*3/uL (ref 3.6–11.0)

## 2013-08-13 LAB — BASIC METABOLIC PANEL
Anion Gap: 8 (ref 7–16)
BUN: 37 mg/dL — ABNORMAL HIGH (ref 7–18)
CHLORIDE: 91 mmol/L — AB (ref 98–107)
Calcium, Total: 8.1 mg/dL — ABNORMAL LOW (ref 8.5–10.1)
Co2: 30 mmol/L (ref 21–32)
Creatinine: 6.9 mg/dL — ABNORMAL HIGH (ref 0.60–1.30)
EGFR (African American): 8 — ABNORMAL LOW
EGFR (Non-African Amer.): 7 — ABNORMAL LOW
Glucose: 369 mg/dL — ABNORMAL HIGH (ref 65–99)
Osmolality: 283 (ref 275–301)
Potassium: 5.8 mmol/L — ABNORMAL HIGH (ref 3.5–5.1)
Sodium: 129 mmol/L — ABNORMAL LOW (ref 136–145)

## 2013-08-13 LAB — TROPONIN I

## 2013-08-13 LAB — APTT: ACTIVATED PTT: 29.3 s (ref 23.6–35.9)

## 2013-08-14 LAB — BASIC METABOLIC PANEL
ANION GAP: 7 (ref 7–16)
BUN: 25 mg/dL — ABNORMAL HIGH (ref 7–18)
CO2: 29 mmol/L (ref 21–32)
Calcium, Total: 8.1 mg/dL — ABNORMAL LOW (ref 8.5–10.1)
Chloride: 96 mmol/L — ABNORMAL LOW (ref 98–107)
Creatinine: 5.2 mg/dL — ABNORMAL HIGH (ref 0.60–1.30)
EGFR (Non-African Amer.): 10 — ABNORMAL LOW
GFR CALC AF AMER: 11 — AB
GLUCOSE: 437 mg/dL — AB (ref 65–99)
Osmolality: 288 (ref 275–301)
POTASSIUM: 4.1 mmol/L (ref 3.5–5.1)
SODIUM: 132 mmol/L — AB (ref 136–145)

## 2013-08-14 LAB — APTT: Activated PTT: 101.2 secs — ABNORMAL HIGH (ref 23.6–35.9)

## 2013-08-14 LAB — HEMOGLOBIN: HGB: 13.5 g/dL (ref 12.0–16.0)

## 2013-08-14 LAB — MAGNESIUM: MAGNESIUM: 2 mg/dL

## 2013-08-15 LAB — PLATELET COUNT: Platelet: 201 10*3/uL (ref 150–440)

## 2013-08-15 LAB — APTT: ACTIVATED PTT: 28.6 s (ref 23.6–35.9)

## 2013-08-16 LAB — POTASSIUM: POTASSIUM: 4.4 mmol/L (ref 3.5–5.1)

## 2013-08-16 LAB — HEMOGLOBIN: HGB: 13.4 g/dL (ref 12.0–16.0)

## 2013-08-16 LAB — PHOSPHORUS: Phosphorus: 4.7 mg/dL (ref 2.5–4.9)

## 2013-08-17 LAB — CBC WITH DIFFERENTIAL/PLATELET
BASOS ABS: 0.1 10*3/uL (ref 0.0–0.1)
Basophil %: 1.4 %
EOS PCT: 2 %
Eosinophil #: 0.1 10*3/uL (ref 0.0–0.7)
HCT: 39.1 % (ref 35.0–47.0)
HGB: 12.7 g/dL (ref 12.0–16.0)
LYMPHS ABS: 1.2 10*3/uL (ref 1.0–3.6)
Lymphocyte %: 31.5 %
MCH: 30.1 pg (ref 26.0–34.0)
MCHC: 32.5 g/dL (ref 32.0–36.0)
MCV: 93 fL (ref 80–100)
Monocyte #: 0.5 x10 3/mm (ref 0.2–0.9)
Monocyte %: 11.5 %
NEUTROS PCT: 53.6 %
Neutrophil #: 2.1 10*3/uL (ref 1.4–6.5)
Platelet: 173 10*3/uL (ref 150–440)
RBC: 4.22 10*6/uL (ref 3.80–5.20)
RDW: 17.3 % — AB (ref 11.5–14.5)
WBC: 4 10*3/uL (ref 3.6–11.0)

## 2013-08-17 LAB — COMPREHENSIVE METABOLIC PANEL
ALT: 17 U/L (ref 12–78)
ANION GAP: 7 (ref 7–16)
Albumin: 2.7 g/dL — ABNORMAL LOW (ref 3.4–5.0)
Alkaline Phosphatase: 246 U/L — ABNORMAL HIGH
BUN: 22 mg/dL — AB (ref 7–18)
Bilirubin,Total: 0.3 mg/dL (ref 0.2–1.0)
CO2: 33 mmol/L — AB (ref 21–32)
CREATININE: 5.5 mg/dL — AB (ref 0.60–1.30)
Calcium, Total: 9 mg/dL (ref 8.5–10.1)
Chloride: 96 mmol/L — ABNORMAL LOW (ref 98–107)
EGFR (Non-African Amer.): 9 — ABNORMAL LOW
GFR CALC AF AMER: 11 — AB
Glucose: 216 mg/dL — ABNORMAL HIGH (ref 65–99)
Osmolality: 282 (ref 275–301)
Potassium: 4.5 mmol/L (ref 3.5–5.1)
SGOT(AST): 17 U/L (ref 15–37)
SODIUM: 136 mmol/L (ref 136–145)
TOTAL PROTEIN: 7.8 g/dL (ref 6.4–8.2)

## 2013-08-17 LAB — CULTURE, BLOOD (SINGLE)

## 2013-08-18 LAB — PHOSPHORUS: PHOSPHORUS: 4.3 mg/dL (ref 2.5–4.9)

## 2013-08-20 LAB — WOUND AEROBIC CULTURE

## 2013-09-13 IMAGING — US US PELV - US TRANSVAGINAL
1 series · 13 of 25 positions shown · non-contrast
Comparison: none

REASON FOR EXAM: VAG BLEEDING
COMMENTS:   May transport without cardiac monitor

[Series 1: us pelv - us transvaginal · 0.27mm/px · 13 of 30 slices shown]
[im 1/30]
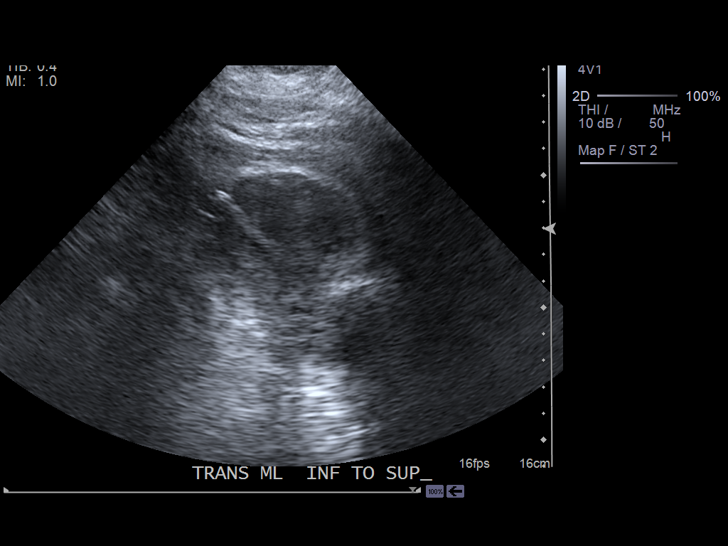
[im 3/30]
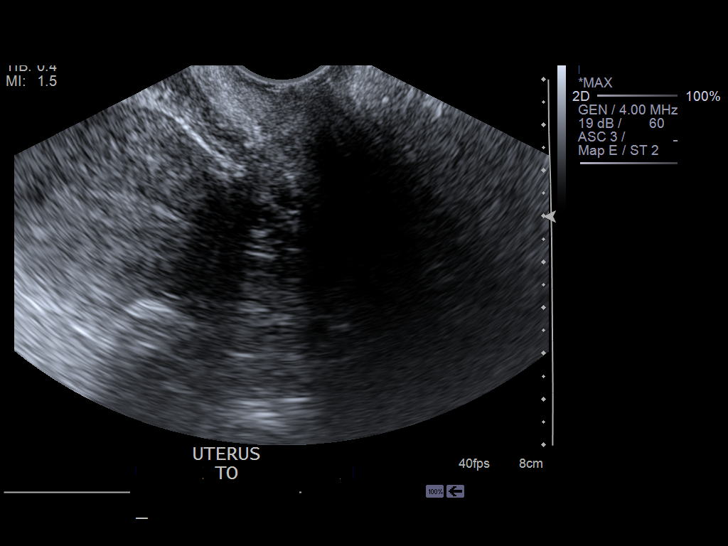
[im 5/30]
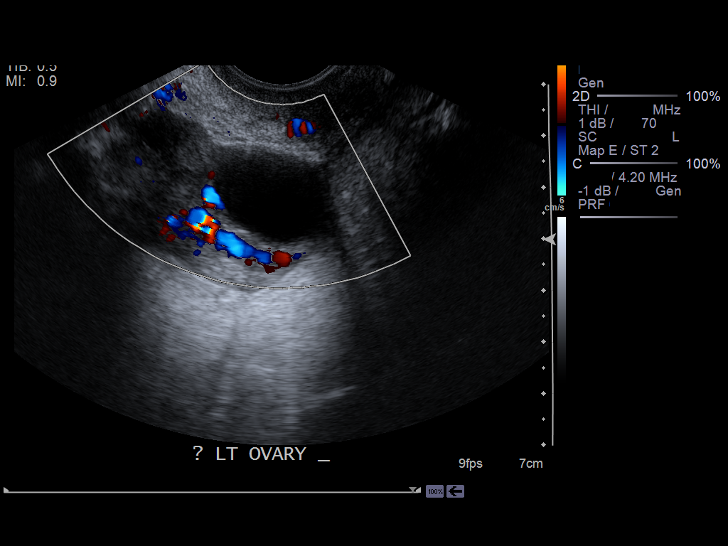
[im 8/30]
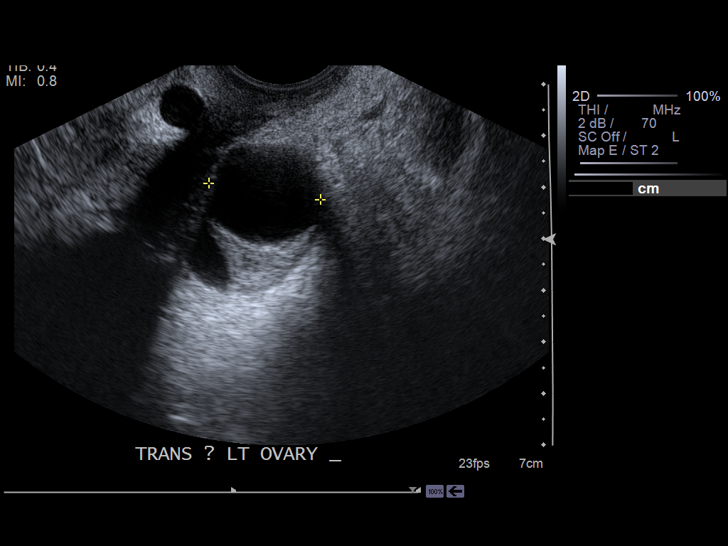
[im 10/30]
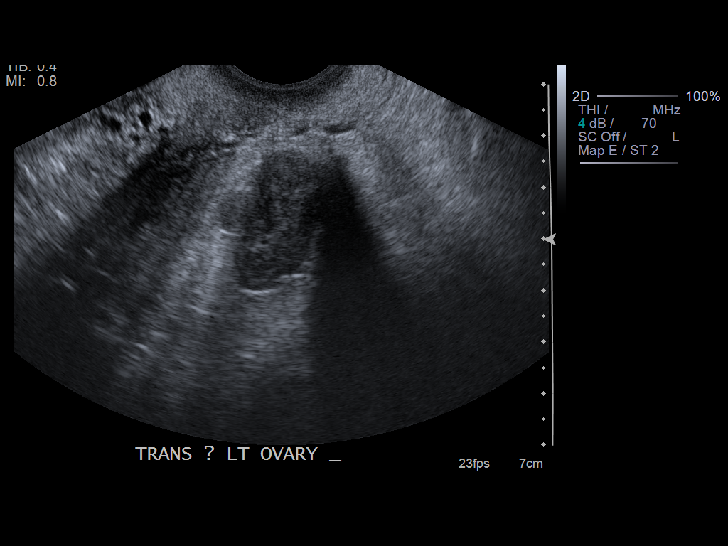
[im 13/30]
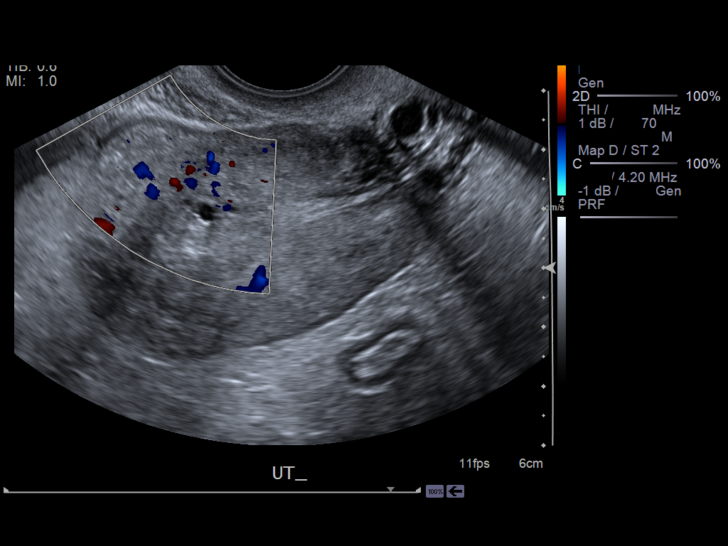
[im 15/30]
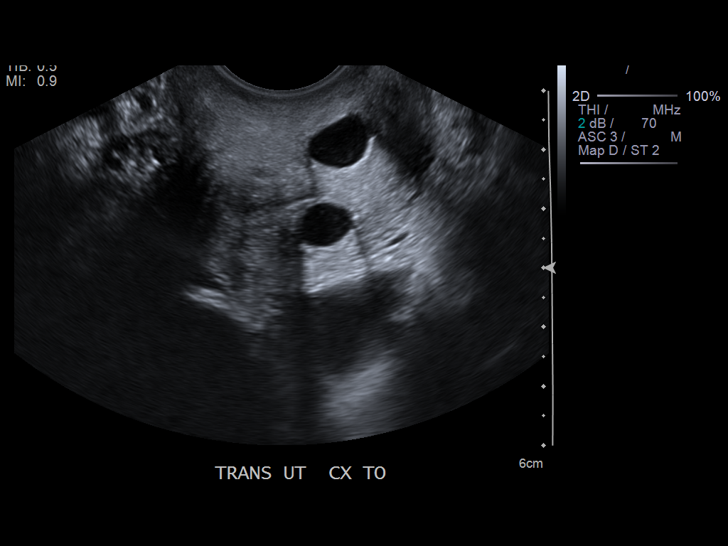
[im 17/30]
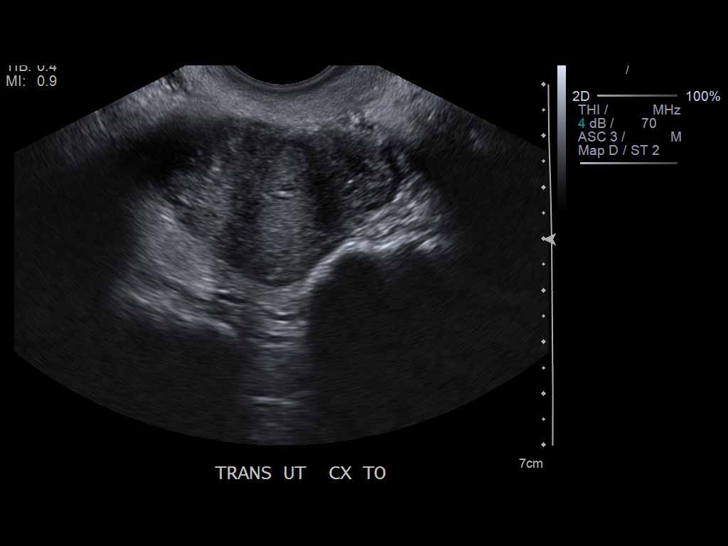
[im 20/30]
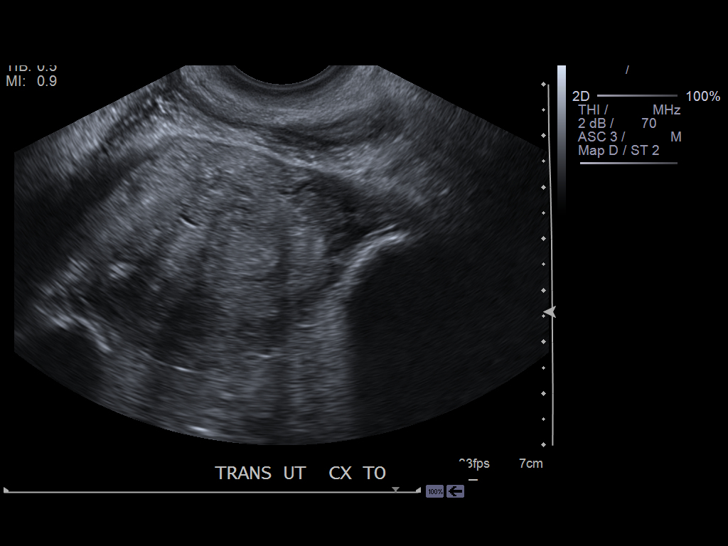
[im 22/30]
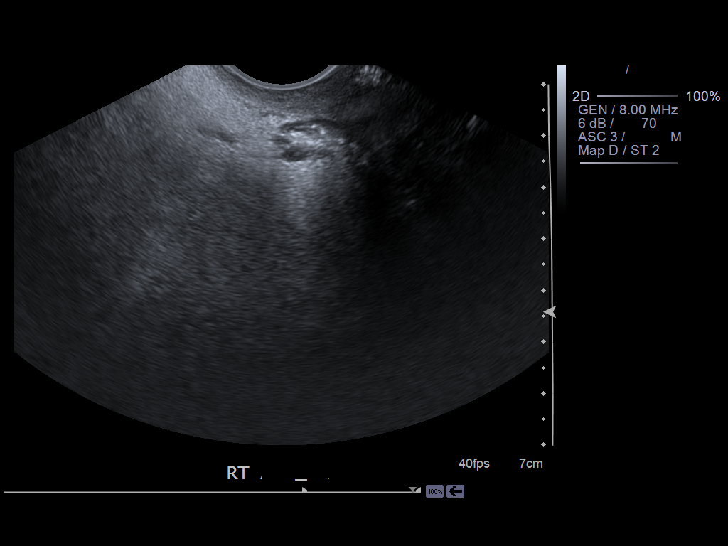
[im 25/30]
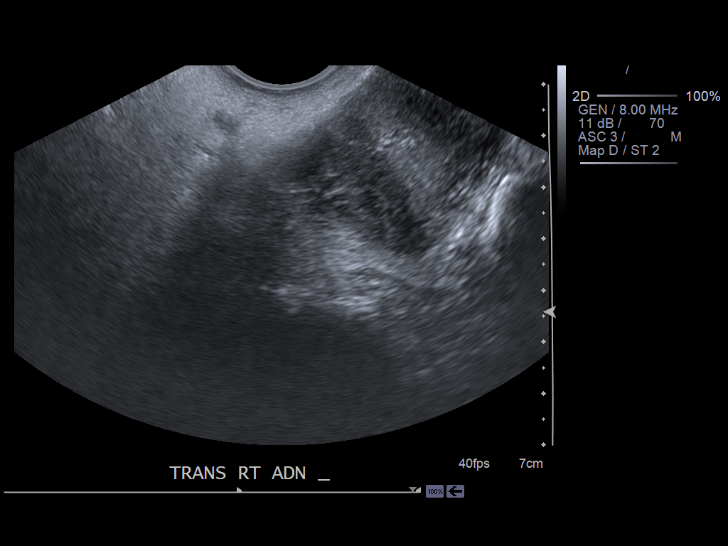
[im 27/30]
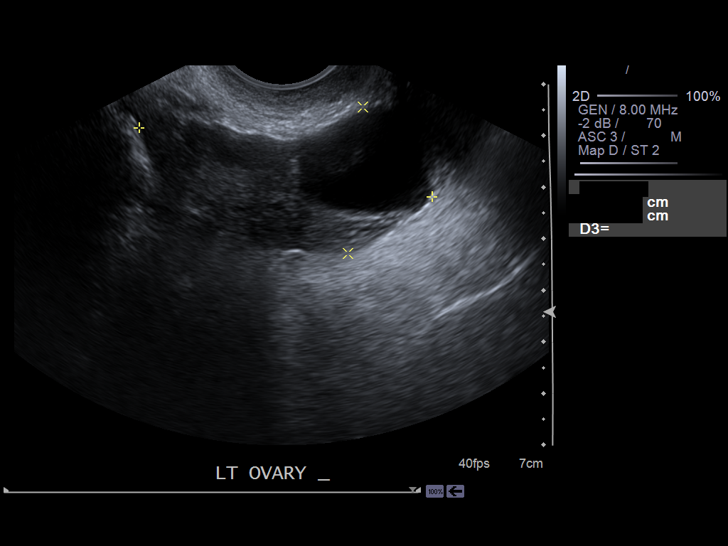
[im 30/30]
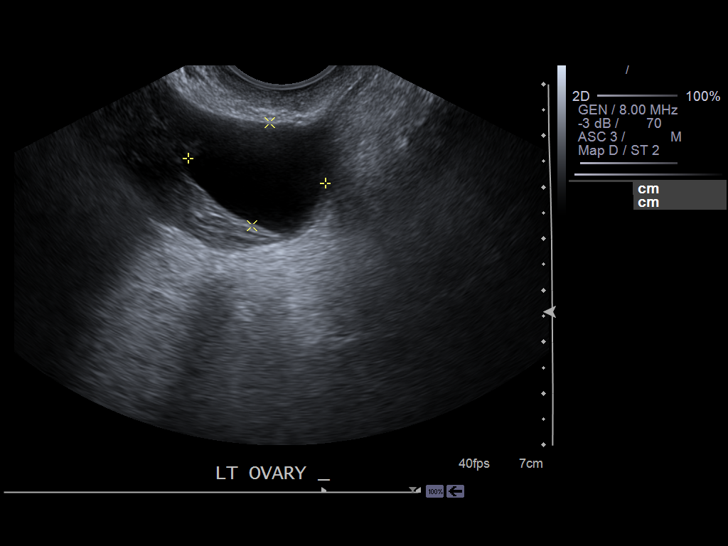

[13 of 25 positions shown; findings below may reference images not displayed]

PROCEDURE:     US  - US PELVIS EXAM W/TRANSVAGINAL  - October 10, 2012  [DATE]

RESULT:     The study is limited due to the patient's body habitus as well
as due to the patient's previous CVA.

The superior aspect of the uterus was not well demonstrated. There are
echogenic structures in the right and left aspects of the fundus that are of
uncertain etiology. These could reflect contraceptive devices and similar
findings were seen on the CT scan June 24, 2011. The uterus measures
7.4 x 4.8 x 4.3 centimeters. The endometrial stripe measures 7.6 mm.

The right ovary could not be demonstrated. The left ovary measures 5.8 x
by 2. 8 cm and contains a cyst measuring 2.7 cm in dimension. The right
kidney was not visible. Portions of the left kidney were visible.
IMPRESSION: 1. The endometrial stripe is not abnormally thickened. No abnormal
endometrial fluid collections are demonstrated. There are nabothian cysts
present. There are echogenic structures in the forms of the uterus that
likely reflects contraceptive devices.
2. The right ovary was not visible. The left ovary exhibits a cyst.

A preliminary report was sent to the [HOSPITAL] the conclusion
of the study.

[REDACTED]

## 2014-09-11 NOTE — Discharge Summary (Signed)
PATIENT NAME:  Stacy MayerOCH, Lanyah D MR#:  [id number] DATE OF BIRTH:  1976-02-11  DATE OF ADMISSION:  02/16/2012 DATE OF DISCHARGE:  02/17/2012  PRESENTING COMPLAINT: Shortness of breath.   DISCHARGE DIAGNOSES:  1. Shortness of breath secondary to volume overload due to missing dialysis.  2. End stage renal disease. The patient is on hemodialysis. She got dialyzed on 02/16/2012 and 02/17/2012 with ultrafiltration.  3. Hypertension.  4. Morbid obesity.  5. Type 2 diabetes.    6. Chronic pain syndrome.    CONDITION ON DISCHARGE: Fair.   MEDICATIONS:  1. Percocet 10/325 on tablet 4 times a day as needed.  2. Plavix 75 mg daily.  3. NovoLog per sliding scale.  4. Symbicort 160/4.5 two puffs b.i.d.  5. MiraLAX 17 grams orally once a day as needed.  6. Plavix 40 mg b.i.d.  7. Ambien 10 mg at bedtime.  8. Tylenol caplet 500 mg 2 every 6 hours as needed.  9. Zofran 8 mg every 4 hours as needed.  10. Nitrostat 0.4 mg sublingual p.r.n.  11. Zoloft 100 mg 2 tablets once a day.  12. Renvela 800 mg 3 tablets t.i.d. with meals, 1 tablet b.i.d. with snacks. 13. Zoloft 100 mg 2 tablets once a day.  14. Alprazolam 2 mg 1 tablet 1 to 2 times a day.  15. Combivent Respimat 1 puff b.i.d.   carbohydrate controlled renal diet.   FOLLOWUP:  1. Resume your hemodialysis as before.   2. Follow up with Dr. Lacie ScottsNiemeyer in 1 to 2 weeks.   LABORATORY DATA: Phosphorus 2.5. Chest x-ray: Chronic-appearing right lung base atelectasis versus fibrosis. CBC within normal limits. Basic metabolic panel within normal limits except glucose of 185, BUN of 68, creatinine of 12.4, alkaline phosphatase is 204, SGOT is 10.   Nephrology consultation Dr. Thedore MinsSingh.   BRIEF SUMMARY OF HOSPITAL COURSE: Stacy Bean is a 39 year old morbidly obese African American female with past medical history of hypertension, diabetes, end-stage renal disease, came into the emergency room with shortness of breath and volume overload due to missed  dialysis. She was admitted with:  1. Shortness of breath, likely due to fluid overload although she clinically appears stable with saturations 96% to 97% on 2 liters nasal cannula. She underwent hemodialysis yesterday with ultrafiltration about 3800 mL and is going to get dialysis today, her routine before discharge. She is weaned to room air. Her symptoms are back to normal. Left leg pain, status post fall. X-rays in the emergency room were negative. The patient has history of chronic pain. She takes Percocet p.r.n.  2. Hyperlipidemia. Continue simvastatin.  3. End stage renal disease, on hemodialysis.  4. Chronic pain. Continue oxycodone and Percocet as before.  5. Type 2 diabetes. Continue her Lantus and NovoLog sliding scale.  6. Gastroesophageal reflux disease.  On Protonix. 7. History of cerebrovascular accident on Plavix.  8. Hospital stay otherwise remained stable.   CODE STATUS: PATIENT REMAINED A FULL CODE.   TIME SPENT: 40 minutes.   ____________________________ Wylie HailSona A. Allena KatzPatel, MD sap:vtd D: 02/17/2012 14:07:38 ET T: 02/18/2012 11:37:59 ET JOB#: [id number] cc: Damen Windsor A. Allena KatzPatel, MD, <Dictator> Meindert A. Lacie ScottsNiemeyer, MD Willow OraSONA A Joniah Bednarski MD ELECTRONICALLY SIGNED 03/02/2012 9:28

## 2014-09-11 NOTE — H&P (Signed)
PATIENT NAME:  Stacy Bean, Stacy Bean MR#:  [id number] DATE OF BIRTH:  1975/07/04  DATE OF ADMISSION:  02/16/2012  PRIMARY CARE PHYSICIAN: Evelene CroonMeindert Niemeyer, MD   CHIEF COMPLAINT: Shortness of breath.   HISTORY OF PRESENT ILLNESS: Ms. Stacy Bean is a 39 year old African American morbidly obese female with a history of end-stage renal disease, type 2 diabetes, hypertension, comes to the Emergency Room after she missed her dialysis treatment yesterday because she came to the ER for evaluation after a fall from the wheelchair. She states her knees are hurting. No fractures were noted on ER work-up. She called her Outpatient Unit, but there was no chair available and, therefore, she presented to the Emergency Room for treatment. She feels short of breath, but she is 96 to 97% on 2 liters nasal cannula oxygen. She is being admitted for further evaluation for hemodialysis.   PAST MEDICAL/SURGICAL HISTORY:  1. End-stage renal disease.  2. Type 1 diabetes since age 89.  3. Hypertension.  4. Thalamic stroke with residual left-sided weakness.  5. Chronic pain syndrome, on chronic narcotics.  6. Cholecystectomy in 2010.  7. Breast mass benign biopsy.  8. Hyperlipidemia.  9. Morbid obesity.  10. Peripheral vascular disease.  11. History of MRSA septicemia in the past.   ALLERGIES: Nonsteroidal anti-inflammatory drugs, tramadol, Demerol, ibuprofen, Keppra.   SOCIAL HISTORY: [place] She smokes about 2 to 3 cigarettes per day. No alcohol or drug abuse. She requires intermittent wheelchair or walker for ambulation.   FAMILY HISTORY: Positive for coronary artery disease, diabetes and breast cancer.    CURRENT MEDICATIONS:  1. Alprazolam 2 mg, 1 tablet 1 to 2 times a day as needed.  2. Ambien 10 mg once a day at bedtime.  3. Combivent Respimat 20/100 mcg inhalation, 1 puff b.i.Bean.  4. MiraLax 17 grams daily p.r.n.  5. Nitrostat 4.4 mg sublingual as needed.  6. NovoLog sliding  scale.  7. Lantus 45 units at bedtime.  8. Percocet 10/325, 1 tablet q.i.Bean.  9. Plavix 75 mg daily.  10. Simvastatin 40 mg daily.  11. Symbicort 160/4.5, 1 inhalation b.i.Bean.  12. Tylenol Extra Strength 500 mg, 2 tablets as needed.  13. Zofran 8 mg every 4 hours as needed.  14. Zoloft 100 mg, 2 tablets p.o. daily.  15. Renvela 800 mg, 3 tablets t.i.Bean. with meals.    REVIEW OF SYSTEMS: CONSTITUTIONAL: No fever. Positive for fatigue, weakness. EYES: No blurred or double vision. ENT: No tinnitus, ear pain, hearing loss. RESPIRATORY: No cough or wheeze. Positive for shortness of breath. CARDIOVASCULAR: No chest pain, orthopnea, edema. Positive for shortness of breath. Positive for hypertension. GI: No nausea, vomiting, diarrhea, or abdominal pain. GU: No dysuria or hematuria. ENDOCRINE: No polyuria or nocturia. HEMATOLOGY: Positive for chronic anemia. SKIN: No acne or rash. MUSCULOSKELETAL: Positive for chronic pain. PSYCHIATRIC: No anxiety or depression. All other systems are reviewed and negative.   PHYSICAL EXAMINATION:  GENERAL: The patient is morbidly obese, awake, alert, oriented x3, not in acute distress.   VITAL SIGNS: Afebrile, pulse is 90 and regular, blood pressure is 110/81, saturations are 96% on 2 liters.   HEENT: Atraumatic, normocephalic. Pupils are equal, round, and reactive to light and accommodation. Extraocular movements are intact. Oral mucosa is moist.   NECK: Supple. No JVD. No carotid bruit.   RESPIRATORY: There are decreased breath sounds in the bases. No respiratory distress or labored breathing.   CARDIOVASCULAR: Both the heart sounds are normal. Rate, rhythm  is regular. PMI is not lateralized. Chest is nontender.   EXTREMITIES: Good pedal pulses. There is trace lower extremity edema. Femoral pulse is not palpable given the patient being morbidly obese.   ABDOMEN: Soft, benign, and nontender. No organomegaly.   NEUROLOGICAL: Grossly intact cranial nerves II through  XII. No major motor or sensory deficits.   PSYCHIATRIC: The patient is awake, alert, oriented x3.   LABORATORY, DIAGNOSTIC AND RADIOLOGICAL DATA: Chest x-ray shows chronic- appearing right lung base atelectasis versus fibrosis. CBC within normal limits. Comprehensive metabolic panel within normal limits except glucose of 185, alkaline phosphatase of 204, albumin of 3.2. Lumbar AP spine done yesterday: No acute osseous abnormality. AP pelvis: No acute osseous injury to the pelvis. Left femur: There is a left femur fracture. There is a fracture deformity of the distal femoral diaphysis transfixed with a sideplate and multiple interlocking screws. There is persistence of the fracture cleft concerning for an ununited fracture. There is no acute new fracture or dislocation. Soft tissues are normal.   ASSESSMENT AND PLAN: Mialani Reicks is a 39 year old with end-stage renal disease  on hemodialysis, diabetes, hypertension, hyperlipidemia, history of deep venous thrombosis in the past, morbid obesity, presents with shortness of breath due to missed dialysis. She is being admitted with:   1. Shortness of breath: Likely due to fluid overload although clinically she appears stable with saturations of 96 to 97% on 2 liters nasal cannula oxygen. She will be taken for dialysis today, per Dr. Thedore Mins. We will admit on a regular floor and continue 2 grams sodium renal diet. Further dialysis instructions per Dr. Thedore Mins. Continue her oxygen and nebulizer treatment, if needed.  2. Left leg pain status post fall yesterday: X-rays of the knee are negative for acute fracture. Percocet p.r.n. will be given to the patient.  3. Hyperlipidemia: Continue simvastatin.  4. Iron deficiency anemia: Stable, the patient is on ferrous sulfate.  5. End-stage renal disease, on hemodialysis: Dr. Thedore Mins is to see the patient. Continue Renvela.  6. Chronic pain: Continue oxycodone/Percocet.  7. Type 2 diabetes: The patient is on Lantus and  NovoLog sliding scale, which we will resume.  8. Gastroesophageal reflux disease: Continue Protonix.  9. History of stroke: The patient is on Plavix.   Further work-up according to the patient's clinical course.    TIME SPENT: 50 minutes.  ____________________________ Wylie Hail Allena Katz, MD sap:cbb Bean: 02/16/2012 12:54:04 ET T: 02/16/2012 13:09:48 ET JOB#: [id number]  cc: Surena Welge A. Allena Katz, MD, <Dictator> Meindert A. Lacie Scotts, MD Willow Ora MD ELECTRONICALLY SIGNED 02/16/2012 16:11

## 2014-09-14 NOTE — Discharge Summary (Signed)
PATIENT NAME:  Stacy Bean, Stacy Bean MR#:  [id number] DATE OF BIRTH:  10/22/1975  DATE OF ADMISSION:  10/15/2012 DATE OF DISCHARGE:  10/18/2012  ADMITTING DIAGNOSIS: Vaginal bleeding as well as acute blood loss anemia.   DISCHARGE DIAGNOSES: 1.  Vaginal bleeding due to dysfunctional uterine bleeding, status post evaluation with Gynecology. The patient underwent a dilation and curettage and Mirena intrauterine device insertion.  Hemoglobin is stable status post transfusion.  2.  End-stage renal disease on hemodialysis. Followed by [facility].  3.  Diabetes, type 1, since age 64.  44.  Hypertension.  5.  History of thalamic cerebrovascular accident with residual left-sided weakness.  6.  Chronic pain syndrome with chronic narcotic therapy.  7.  History of breast mass with benign biopsy.  8.  Hyperlipidemia.  9.  Morbid obesity.  10.  Peripheral vascular disease.  11.  History of methicillin-resistant Staphylococcus aureus bacteremia.  12.  History of left upper arm extremity arteriovenous fistula.  13.  Status post cholecystectomy.   PERTINENT LABORATORY DATA: BMP: Glucose 221, BUN 21, creatinine 5.69, sodium 135, potassium 3.8, chloride was 95, CO2 was 34, calcium was 7.7, magnesium 1.8. LFTs: Total protein 8.1, albumin 3.3, bili total 0.3, alk phos was 260, AST 15, ALT 15. CPK 264. CK-MB was 1.8. Troponin less than 0.2. WBC 9.9, hemoglobin 6.4, platelet count 246. Blood cultures no growth x 2.  Most recent hemoglobin is 9.9.   HOSPITAL COURSE: Please refer to H and P done by the admitting physician. The patient is a 39 year old African American female with the above-stated medical problems who was recently hospitalized with the same complaints and left AGAINST MEDICAL ADVICE on 05/22, returned back with recurrent vaginal bleeding. She was hypotensive and anemic on presentation. She was given IV fluids as well as transfused. She was seen in consultation by GYN. She underwent a dilation and  curettage and Mirena IUD insertion. The patient tolerated the procedure without any complications. At this time, her hemoglobin is stable. She will need outpatient GYN follow-up. She is currently stable for discharge.   DISCHARGE MEDICATIONS: Plavix 75 p.o. daily, simvastatin 40 at bedtime, Symbicort 2 puffs b.i.d., MiraLax 17 grams daily as needed, Ambien 10 at bedtime, Tylenol Extra Strength 1000 mg q. 6 p.r.n. for pain, Nitrostat 0.4 sublingual p.r.n., Zoloft 200 daily, Combivent 1 puff b.i.d., Sensipar 60 daily, promethazine 25 q. 6 p.r.n., alprazolam 2 mg 1 tab p.o. t.i.d. as needed, Atarax 10 mg q. 4 p.r.n., gabapentin 100 at bedtime, Percocet 5/325 q. 4 to 6 p.r.n. for pain, Protonix 40 mg 1 tab p.o. b.i.d., Renvela 800 mg 3 tabs 3 times a day, Levemir 45 units 2 times a day.   DIET: Carbohydrate controlled diet , renal diet.    ACTIVITY: As tolerated.   FOLLOWUP: With primary M.D. in 1 to 2 weeks. Follow up with Dr. Verlene Mayer of GYN.  Continue hemodialysis as doing previously.    TIME SPENT: 35 minutes spent on the discharge.   ____________________________ Lafonda Mosses. Posey Pronto, MD shp:cb D: 10/18/2012 13:55:58 ET T: 10/18/2012 14:14:12 ET JOB#: [id number]  cc: Linn Goetze H. Posey Pronto, MD, <Dictator> Alric Seton MD ELECTRONICALLY SIGNED 10/20/2012 14:57

## 2014-09-14 NOTE — H&P (Signed)
PATIENT NAME:  Stacy Bean, Stacy Bean MR#:  [id number] DATE OF BIRTH:  09-21-1975  DATE OF Gatha MayerDMISSION:  10/20/2012  PRIMARY CARE PHYSICIAN[facility].   CHIEF COMPLAINT: Lethargy, altered mental status.   HISTORY OF PRESENT ILLNESS: This is a 39 year old female who was sent over from dialysis today due to lethargy and weakness as the patient was snoring during most of the treatment. She was unarousable at dialysis and therefore sent over to the ER. The patient has a history of previous CVA and therefore has significant aphasia. Therefore, it is difficult to obtain a good history from the patient. The patient presently is still lethargic but appropriate and answers questions and is alert and oriented. The patient was brought to the ER and noted to have mild hypercarbic respiratory failure with mildly elevated CO2's on ABG, although she had a normal pH. The patient was also noted to have acute diarrhea. Hospitalist service was contacted for further treatment and evaluation. The patient presently denies any chest pain, shortness of breath, nausea, nausea or vomiting. Does admit to diarrhea, which has been ongoing for the past couple of days. No fevers, no chills, no abdominal pain, no other associated symptoms.   REVIEW OF SYSTEMS: CONSTITUTIONAL: No documented fever. No weight gain. No weight loss.  EYES: No blurry or double vision.  ENT: No tinnitus. No postnasal drip. No redness of oropharynx.  RESPIRATORY: No cough. No wheeze. No hemoptysis. No dyspnea.  CARDIOVASCULAR: No chest pain. No orthopnea. No palpitations. No syncope.  GASTROINTESTINAL: No nausea. No vomiting. Positive diarrhea. No abdominal pain. No melena or hematochezia.  GENITOURINARY: No dysuria or hematuria.  ENDOCRINE: No polyuria or nocturia. No heat or cold intolerance. HEMATOLOGIC: No anemia. No bruising. No bleeding.  INTEGUMENTARY: No rashes. No lesions.  MUSCULOSKELETAL: No arthritis.  No swelling. No gout.  NEUROLOGIC: No numbness or tingling. No ataxia. No seizure-type activity.  PSYCHIATRIC: Positive depression and anxiety. No ADD.   PAST MEDICAL HISTORY: Is consistent with diabetes, diabetic neuropathy, morbid obesity, obstructive sleep apnea, COPD with ongoing tobacco abuse, anxiety with depression. History of previous CVA.  History of recent vaginal bleeding and status post IUD placement. Chronic anemia.   ALLERGIES: DEMEROL, IBUPROFEN, KEPPRA, AND TRAMADOL.  SOCIAL HISTORY: Lives independently with her boyfriend. Smokes about a few cigarettes per day. Denies any illicit drug abuse, but is positive for cocaine.   FAMILY HISTORY: The patient's mother recently died and had history of end-stage renal disease.   CURRENT MEDICATIONS:  1.  Abilify 10 mg daily. 2.  Tylenol 650 q. 4 hours as needed. 3.  Xanax 2 mg t.i.Bean. as needed. 4.  Ambien 10 mg daily at bedtime. 5.  Baclofen 10 mg t.i.Bean. as needed. 6.  Combivent 1 puff b.i.Bean. 7.  Cyclobenzaprine 10 mg t.i.Bean. 8.  Iron sulfate 325 mg daily. 9.  EMLA 2.5% topical cream to be applied for dialysis Tuesday, Thursday and Saturday. 10.  Gabapentin 100 mg daily, 1 additional capsule at dialysis. 11.  Levemir 35 to 40 units at bedtime. 12.  Medroxyprogesterone 10 mg daily. 13.  Sublingual nitroglycerin as needed. 14.  Percocet 10/325 mg 1 tab t.i.Bean.  15.  Plavix 75 mg daily. 16.  Protonix 40 mg daily. 17.  Renvela 800 mg 3 tabs t.i.Bean. with meals and 1 tab twice daily with snacks. 18.  Sensipar 60 mg daily. 19.  Symbicort 2 puffs b.i.Bean. 20.  Albuterol inhaler 2 puffs q.i.Bean. as needed. 21.  Vitamin  D2 50,000 international units weekly. 22.  Zofran 4 mg q. 6 hours as needed. 23.  Zoloft 100 mg daily.   PHYSICAL EXAMINATION: On admission is as follows:  VITAL SIGNS:  Are noted to be: Temperature is 98.8, pulse 95, respirations 24, blood pressure 112/70 and sats 98% on 2 liters nasal cannula.  GENERAL: She is a very  lethargic-appearing female who is mildly aphasic, but in no apparent distress.  HEENT: Atraumatic, normocephalic. Extraocular muscles are intact. Pupils equal and reactive to light. Sclerae anicteric. No conjunctival injection. No pharyngeal erythema.  NECK: Supple. There is no jugular venous distention. No bruits. No lymphadenopathy. No thyromegaly.  HEART: Regular rate and rhythm, tachycardic. No murmurs. No rubs. No clicks.  LUNGS: Clear to auscultation bilaterally. No rales. No rhonchi. No wheezes. Poor respiratory effort.  ABDOMEN: Soft, flat, nontender, nondistended. Has good bowel sounds. No hepatosplenomegaly appreciated.  EXTREMITIES: No evidence of any cyanosis or clubbing. Does have +1 to 2 pitting edema from the knees to ankles bilaterally. +2 pedal and radial pulses bilaterally.  NEUROLOGIC: She is alert, awake and oriented x 3, globally weak. Difficult to do full neurological exam, but moves all extremities spontaneously.  SKIN: Moist and warm with no rashes.  LYMPHATIC: There is no cervical or axillary lymphadenopathy.   LABORATORY AND DIAGNOSTICS: Showed a serum glucose of 221, BUN 32, creatinine 7.5, sodium 131, potassium 4.2, chloride 95, bicarb 28. LFTs are within normal limits. Urine tox is positive for cocaine. White cell count 8, hemoglobin 10.6, hematocrit 30.2, platelet count 242.   ABG showed a pH of 7.39, pCO2 of 54, pO2 of 60, sats 91%.   The patient did have a CT of the head done without contrast which showed no acute intracranial abnormality. The patient also had a chest x-ray done which showed right lung base atelectasis versus pneumonia with hypoinflation and interstitial prominence.   ASSESSMENT AND PLAN: This is a 39 year old female with a history of end-stage renal disease on hemodialysis, morbid obesity, obstructive sleep apnea, diabetes, previous cerebrovascular accident, anxiety/depression, diabetic neuropathy, chronic obstructive pulmonary disease with ongoing  tobacco abuse who presents to the hospital due to altered mental status, lethargy and also noted to have acute diarrhea. 1.  Altered mental status/lethargy. The likely cause of this is multifactorial, related to underlying sleep apnea, for which she is not using CPAP for now. Also polypharmacy. The patient was discharged on some Percocet recently. The patient's ABG showed some CO2 retention, but normal pH. For now I will start her on some CPAP at bedtime. I will hold all her sedative meds including Xanax, Percocet, Zoloft and Abilify, and follow her clinically. Her CT head has been negative. She was noted to have a positive urinalysis, although I think this is contaminated given the fact that she has acute diarrhea.  We will repeat the urinalysis once her diarrhea improves. I will follow q. 4 hour neuro checks.  2.  Acute diarrhea. Again, the etiology of this is unclear. The patient was not on any recent antibiotics. I will check her stool for Clostridium difficile, comprehensive culture, ova and parasites, place her on a clear liquid diet and start her on some p.r.n. Imodium.  3.  Endstage renal disease on hemodialysis. I will consult nephrology. Continue dialysis as per them.  4.  Diabetes. Continue her Levemir. Continue sliding scale insulin.  5.  Depression/anxiety. For now, given her altered mental status, I will hold her Xanax, Abilify and Zoloft for now.  6.  Diabetic  neuropathy. Continue Neurontin. 7.  History of previous cerebrovascular accident. Continue Plavix.  8.  Gastroesophageal reflux disease. Continue Protonix. 9.  Chronic obstructive pulmonary disease.  No evidence of acute chronic obstructive pulmonary disease exacerbation. I will continue her Symbicort and Combivent for now.   CODE STATUS: The patient is a FULL CODE.   TIME SPENT: 50 minutes. ____________________________ Rolly Pancake. Cherlynn Kaiser, MD vjs:sb Bean: 10/20/2012 12:20:12 ET T: 10/20/2012 12:31:06 ET JOB#: [id number]  cc: Rolly Pancake.  Cherlynn Kaiser, MD, <Dictator> Houston Siren MD ELECTRONICALLY SIGNED 10/30/2012 20:34

## 2014-09-14 NOTE — Op Note (Signed)
PATIENT NAME:  Stacy Bean, Stacy Bean MR#:  [id number] DATE OF BIRTH:  03-23-1976  DATE OF PROCEDURE:  10/16/2012  PREOPERATIVE DIAGNOSIS: Severe menorrhagia with resultant severe anemia and multiple medical problems.   POSTOPERATIVE DIAGNOSIS:  Severe menorrhagia with resultant severe anemia and multiple medical problems.   OPERATION PERFORMED: 1.  Dilation and curettage. 2.  Mirena IUD insertion.   SURGEON: Deloris Pinghilip J. Jetty Berland, MD   PREOP SITUATION: This 39 year old gravida 11012, para 2-0-10-2 presented with one of several episodes of severe menorrhagia.  The patient was in the hospital this last week for the same, was given several units of blood and then ultimately signed out AGAINST MEDICAL ADVICE. The patient returned yesterday with similar symptoms.  Her hematocrit at that time was 18%. The patient has been seen by the hospitalist.  The patient received 4 units of blood. Ultimately, her hematocrit got to be 25, and Anesthesia was willing to put her to asleep.   OPERATIVE FINDINGS: Minimal tissue on endometrial curettage; insertion of Mirena went uneventfully.   DESCRIPTION OF PROCEDURE: After adequate general anesthesia, the patient was prepped and draped in routine fashion. The cervix was grasped with a Gerilyn PilgrimJacob tenaculum and sounded to 9 cm. The cervix was dilated with ease. The uterine cavity was systematically curetted with return of a minimal amount of tissue. A Mirena IUD was then inserted without incident. The patient tolerated the procedure well and left the operating room in reasonable condition.  Care  of the patient's medical problems will now be transferred to the medical service.  ____________________________ Deloris PingPhilip J. Luella Cookosenow, MD pjr:cb Bean: 10/16/2012 14:05:04 ET T: 10/16/2012 14:23:44 ET JOB#: [id number] cc: Deloris PingPhilip J. Luella Cookosenow, MD, <Dictator> Towana BadgerPHILIP J Mickal Meno MD ELECTRONICALLY SIGNED 10/17/2012 3:26

## 2014-09-14 NOTE — Consult Note (Signed)
PATIENT NAME:  Stacy Bean, METTLER MR#:  [id number] DATE OF BIRTH:  Oct 20, 1975  HOSPITAL CONSULTATION NOTE  DATE OF CONSULTATION:  10/24/2012  REFERRING PHYSICIAN: Dr. Nemiah Commander.   CONSULTING PHYSICIAN:  Lynnae Prude, MD/Charmane Protzman Arvilla Market, ANP.  PRIMARY CARE PHYSICIAN: Dr. Mikael Spray, [facility], [facility].  REASON FOR CONSULTATION: Diarrhea.   HISTORY OF PRESENT ILLNESS: This 39 year old patient with a complex medical history was admitted to the hospital 10/20/2012 for lethargy. She was unarousable during dialysis. She does have a prior history of CVA and significant aphasia. The patient was brought to the Emergency Room and was found to have mild hypercarbic respiratory failure, with mild elevated CO2's, and with normal PH. She was also having acute diarrhea. The patient was passing large amounts of green, watery stool. Laboratory studies on admission with a BUN of 32, creatinine of 7.56, consistent with dialysis history. Sodium 131, potassium. Albumin was 3.2, and alkaline 3231, which has been elevated last year. Drug screen was positive for cocaine. Hemoglobin was 10.6. Has decreased to 8.2, with history of chronic anemia. C. difficile was negative. Comprehensive stool culture negative. Urinalysis was positive. She has been placed on oral Levaquin.   The patient was evaluated during dialysis. She remains lethargic, and her speech is very difficult to understand. The patient is a poor historian. She denies any diarrhea, and says her last diarrhea episode was about Saturday. She points to the right lower quadrant in the abdomen, and reports she has had discomfort in that region for an undetermined amount of time. She is unable to give any further history.   PAST MEDICAL HISTORY: 1.  End-stage renal disease, on dialysis Monday, Wednesday, Friday.  2.  Insulin-dependent diabetes since age 24.  3.  Hypertension.  4.  Thalamic stroke, with left-sided weakness, 2008.  5.  Morbid obesity.   6.  Chronic pain.  7.  COPD.  8.  History of tobacco use.  9.  Anxiety/depression.  10.  Peripheral vascular disease.  11. Chronic anemia of kidney disease.  PAST SURGICAL HISTORY: 1.  Cholecystectomy in 2010.  2.  Breast biopsy, benign.  3.  Left femoral ORIF.  4.  Exploratory lap for evaluation of endometriosis.  5.  Right fibula fracture repair, 12/2008.  6.  IUD placed 05/14 for menorrhagia.   MEDICATIONS: Current medications: Unable to review with the patient. See admission History and Physcial.   ALLERGIES: To DEMEROL, IBUPROFEN, KEPPRA and TRAMADOL, per H and P.   SOCIAL HISTORY: The patient lives independently with her boyfriend, smokes cigarettes, denies alcohol. Urine drug screen positive for cocaine.   FAMILY HISTORY: Unable to obtain.   REVIEW OF SYSTEMS: Unable to obtain due to patient's lethargy.   PHYSICAL EXAMINATION: VITAL SIGNS: 98.7, 92, 21, 111/76. Pulse ox on 4 liters 92%.  GENERAL: Morbidly obese African American female, sleeping in semi-Fowler's, in dialysis.  HEENT: Conjunctivae pink. Sclerae anicteric. Oral mucosa is dry and intact. Tongue without thrush.  NECK: Supple, obese.  HEART: Heart tones S1, S2 without murmur or gallop.  LUNGS: Essentially CTA on anterior evaluation.  ABDOMEN: Obese, soft. Positive right lower quadrant tenderness.  RECTAL: Deferred as the patient is currently receiving dialysis.  EXTREMITIES: Lower extremities with some edema and obesity.  SKIN: Skin is examined anteriorly, without ulcers. She was not turned on her side for posterior evaluation.   LABORATORY: Glucose 127, BUN 45, creatinine 9.89, sodium is 132, calcium 7.3, phosphorus 4.2, alkaline phosphatase was 231, albumin 3.2 on 10/20/2012.   Hemoglobin 8.1,  hematocrit 24.5, WBC 6.2, platelets 231,000.   Stool negative for C. difficile PCR, 10/22/2012. Blood culture: No growth.   RADIOLOGY: Head CT without contrast performed 10/20/2012 showed no intracranial  hemorrhage; mild periventricular hypoattenuation demonstrated the appearance of chronic microangiopathy, though the patient is somewhat young for this finding.   Chest CT for PE with contrast performed, 10/24/2012 this a.m. showed bibasilar atelectasis predominantly in the posterior aspects of both lower lobes, with minimal atelectatic changes elsewhere. No classic alveolar pneumonia. No suspicious masses. No evidence of acute PE. There may be an element of low-grade interstitial edema.   Sunrise, reviewed from 3 years ago: No abdominal CT studies to compare.   IMPRESSIONS:  1.  The patient presented for mental status change, lethargy, and has developed diarrhea during this admission that has improved.  2.  Right lower quadrant pain and tenderness on exam: Date of onset  unclear.  3.  Complex medical patient with history of CVA, significant aphasia, making it very difficult to understand patient. She is also lethargic at this time. She received pain medication 2 hours ago. The history is limited today.  4.  Multiple medical problems to include end-stage renal disease, insulin-dependent diabetes mellitus, cerebrovascular accident, chronic pain, narcotic use, hypertension, polysubstance abuse and a history of chronic obstructive pulmonary disease, tobacco, anxiety, depression, and history of orthopedic surgery in the left hip.   PLAN: This case was discussed with Dr. Mechele CollinElliott in collaboration of care. He does recommend proceeding to an oral contrast abdomin and pelvis CT for investigation of right lower quadrant pain. Her diarrhea has resolved, at least at this point. Stool studies with the negative C. difficile and comprehensive culture. Further GI recommendations pending CT results.   Thank you for the consultation. These services provided by Cala BradfordKimberly A. Arvilla MarketMills under collaborative practice agreement with Dr. Scot Junobert T. Elliott, M.D.    ____________________________ Ranae PlumberKimberly A. Arvilla MarketMills, ANP (Adult Nurse  Practitioner) kam:dm D: 10/24/2012 13:52:33 ET T: 10/24/2012 14:49:09 ET JOB#: [id number]  cc: Cala BradfordKimberly A. Arvilla MarketMills, ANP (Adult Nurse Practitioner), <Dictator> Ranae PlumberKimberly A. Suzette BattiestMills RN, MSN, ANP-BC Adult Nurse Practitioner ELECTRONICALLY SIGNED 10/25/2012 8:15

## 2014-09-14 NOTE — Consult Note (Signed)
GI:  pt with chronic pain syndrome and neg CT, I will sign off, reconsult if needed.  Electronic Signatures: Scot JunElliott, Nashalie Sallis T (MD)  (Signed on 04-Jun-14 14:03)  Authored  Last Updated: 04-Jun-14 14:03 by Scot JunElliott, Shima Compere T (MD)

## 2014-09-14 NOTE — Consult Note (Signed)
Chief Complaint:  Subjective/Chief Complaint Diarrhea now resloved spontaneously. NO bm in a few days and last stool was soft formed. Chronic lower abd discomfort -unchanged x years, "I have been to the pain clinic for it". Pt thinks it is r/t endometriosis. She has occ nausea/no vomiting and reuquests pain medication.   VITAL SIGNS/ANCILLARY NOTES: **Vital Signs.:   03-Jun-14 13:15  Vital Signs Type Routine  Temperature Temperature (F) 98.2  Celsius 36.7  Temperature Source oral  Pulse Pulse 97  Respirations Respirations 18  Systolic BP Systolic BP 107  Diastolic BP (mmHg) Diastolic BP (mmHg) 67  Mean BP 80  Pulse Ox % Pulse Ox % 89  Pulse Ox Activity Level  At rest  Oxygen Delivery 2L   Brief Assessment:  GEN well developed, obese, disheveled   Cardiac Regular   Respiratory normal resp effort  clear BS   Gastrointestinal details normal Soft  Nondistended  Bowel sounds normal   Gastrointestinal details abnormal Minimum Tenderness   Tenderness diffuse lower abd, right>left   EXTR positive edema   Radiology Results: CT:    03-Jun-14 10:32, CT Abdomen and Pelvis Without Contrast  CT Abdomen and Pelvis Without Contrast   REASON FOR EXAM:    (1) RLQ abd pain/tenderness; (2) same  COMMENTS:       PROCEDURE: CT  - CT ABDOMEN AND PELVIS W0  - Oct 25 2012 10:32AM     RESULT: Indication: Abdominal pain    Comparison: None    Technique: Multiple axial images from the lung bases to the symphysis   pubis were obtained without oral and without intravenous contrast.    Findings:    The lung bases are clear. There is no pleural or pericardial effusions.     Partially visualized are multiple prominent axillary lymph nodes the  largest left axillary lymph node measuring 12 mm in short axis.    No renal, ureteral, or bladder calculi. No obstructive uropathy. No   perinephric stranding is seen. The kidneys are symmetric in size without   evidence for exophytic mass. The  bladder is unremarkable.    The liver demonstrates no focal abnormality. The gallbladder is   surgically absent. The spleen demonstrates no focal abnormality. The   adrenal glands and pancreas are normal.     The unopacified stomach, duodenum, small intestine, and large intestine   are unremarkable, but evaluation is limited by lack of oral contrast.   There is a normal caliber appendix in the right lower quadrant without   periappendiceal inflammatory changes. There is a T type IUD noted. There     is a fat-containing umbilical hernia. There is no pneumoperitoneum,   pneumatosis, or portal venous gas. There is no abdominal or pelvic free   fluid. There is no lymphadenopathy.     The abdominal aorta is normal in caliber . There is mild anasarca.    The osseous structures are unremarkable.    IMPRESSION:     1. Normal appendix.    2. Mild anasarca.    Dictation Site: 1    Verified By: Joellyn HaffHETAL P. PATEL, M.D., MD   Assessment/Plan:  Assessment/Plan:  Assessment Diarrhea resolved. Stool studies negative. Chronic lower abd pain and mild non-specif tenderness with negative findings on non contrasted abd/pelvis CT. Anemia of chronic dx ESRD on dialysis.   Plan Continue with medical management. No recommendation for luminal evaluation at this time per Dr. Mechele CollinElliott. This case was d/w Dr. Mechele CollinElliott in collaboratio of care.  Electronic Signatures: Rowan Blase (NP)  (Signed 03-Jun-14 16:05)  Authored: Chief Complaint, VITAL SIGNS/ANCILLARY NOTES, Brief Assessment, Radiology Results, Assessment/Plan   Last Updated: 03-Jun-14 16:05 by Rowan Blase (NP)

## 2014-09-14 NOTE — Discharge Summary (Signed)
PATIENT NAME:  Stacy Bean, [name] MR#:  [id number] DATE OF BIRTH:  [birth year]  DATE OF ADMISSION:  10/11/2012 DATE OF DISCHARGE:  10/13/2012  The patient signed out AGAINST MEDICAL ADVICE.   PRIMARY DOCTOR:  Dr. Lacie ScottsNiemeyer.  CONSULTATIONS:  OB/GYN consult, Dr. Bonney AidStaebler.  Nephrology consult with Dr. Thedore MinsSingh.  DISCHARGE DIAGNOSES: 1.  Altered mental status due to polypharmacy, resolved.   2.  Menorrhagia with heavy bleeding.  3.  End-stage renal disease.  4.  Type 1 diabetes.  5.  Hypertension.  6.  History of thalamic stroke.  7.  Chronic pain syndrome.  8.  Hyperlipidemia.  9.  Morbid obesity.  10.  Peripheral vascular disease.   DISCHARGE MEDICATIONS: 1.  Xanax 2 mg 1 tablet 3 times daily as needed.  2.  Ambien 10 mg daily.  3.  Atarax 10 mg 4 times daily as needed for itching.  4.  Combivent 1 puff twice daily.  5.  Neurontin 100 mg by mouth daily.  6.  Plavix 75 mg by mouth daily.  7.  Simvastatin 40 mg by mouth daily.  8.  Nitrostat sublingual as needed for chest pain.  9.  Symbicort 160/4.5 2 puffs twice daily.  10.  Protonix 40 mg by mouth twice daily.  11.  The patient is also on Levemir 45 units twice daily.  12.  Medroxyprogesterone tablets 10 mg by mouth daily.   DIET:  1800 calorie ADA renal diet.   FOLLOW-UP:  With Dr. Bonney AidStaebler in one week.   HOSPITAL COURSE:   1.  The patient is a 39 year old morbidly obese female with multiple medical problems, came in because of generalized weakness and sleepiness.  Look at the history and physical for full details.  The patient admitted for lethargy, possibly secondary to multiple sedating medications and also anemia.  The patient's Xanax, Ambien and Percocet were stopped on admission.  She has severe menorrhagia and having bleeding for the past few days.  The patient found to have hemoglobin of 7.4, so patient had a GYN consult with Dr. Bonney AidStaebler and patient had no prior episodes of bleeding and seen by Dr. Bonney AidStaebler and the patient  had a transvaginal ultrasound which did not show anatomical lesions.  The patient had menorrhagia.  Hemoglobin was around 7.2 and the patient started on Provera 10 mg daily by Dr. Bonney AidStaebler on May 21st.  Today, patient felt much better.  Her bleeding is less than yesterday, so Dr. Bonney AidStaebler was thinking of getting an IUD which is preferred over the contraceptives because of her dialysis and also risk for thrombosis.  The patient understands that she needs to take Provera 10 mg daily until she sees Dr. Bonney AidStaebler and also told her that she can take it after the dialysis and then she can continue her Plavix.  The patient has TSH and PT/PTT and prolactin are checked and Dr. Bonney AidStaebler, he can follow them as an outpatient.   2.  Regarding her lethargy, her CT was normal.  She is very oriented when I saw her during dialysis yesterday and the patient said she never took extra doses of her pain medication or Xanax; just that she was so tired with bleeding.  She had to change the pads every 2 hours and she really got tired with that and that is probably the reason that when she came she had weakness.  I told her to take only the prescribed amount of medication at prescribed times and not to take any extra doses.  3.  Hyperglycemia due to diabetes type 1.  The patient's blood sugars were elevated to 400.  We were giving her regular insulin extra doses.  The patient's blood sugar needs to be rechecked in the afternoon after giving the regular insulin.  The patient did not wait.  She did not want [place] to check blood sugars again and she signed out AGAINST MEDICAL ADVICE.  We explained that it is very important to recheck the sugars because her sugars are like very high, but patient really wanted to go.  She signed out AGAINST MEDICAL ADVICE.  4.  For anemia, the patient received 2 units of transfusion and hemoglobin around 8 on May 21st.  On May 20th it was 7.4.   5.  Other lab data:  The patient had a pelvic ultrasound which  showed endometrial stripes not abnormally significant, no abnormal endometrial fluid collection.  The patient had echo dense (Dictation Anomaly) _____of uterus that likely represents contraceptive device.  Left ovary has a cyst and right ovary is not visible, so patient is seen by GYN.  They think that the patient had a (Dictation Anomaly)_____coil and she did not have any IUD as per the GYN evaluation.  6.  For her diabetes, she is advised to continue her Levemir and sliding scale and advised to see Dr. Lacie Scotts.  7.  History of CVA.  She is continued on her aspirin and Plavix.  8.  ESRD, on hemodialysis Tuesday, Thursday and Saturday.   The patient signed out AGAINST MEDICAL ADVICE.   Time spent on total discharge preparation more than 30 minutes.    ____________________________ Katha Hamming, MD sk:ea D: 10/13/2012 20:32:19 ET T: 10/13/2012 20:55:10 ET JOB#: [id number]  cc: Katha Hamming, MD, <Dictator> Katha Hamming MD ELECTRONICALLY SIGNED 10/23/2012 20:20

## 2014-09-14 NOTE — Consult Note (Signed)
PATIENT NAME:  Stacy Bean, Stacy Bean MR#:  [id number] DATE OF BIRTH:  Feb 18, 1976  DATE OF CONSULTATION:  10/15/2012  REFERRING PHYSICIAN:  Dr. Mariel Sleet.  CONSULTING PHYSICIAN:  Ovidio Steele Lizabeth Leyden, MD  REASON FOR CONSULTATION:  Management of anemia of blood loss, hypotension, history of CVA, diabetes mellitus.   HISTORY OF PRESENT ILLNESS:  The patient is a 39 year old African American female with multiple medical problems including end-stage renal disease on hemodialysis Tuesday, Thursday, and Saturday followed at the [facility] by [facility]phrology, type 1 diabetes mellitus since age 62, hypertension, thalamic CVA with residual left-sided weakness, chronic pain syndrome on chronic narcotics, a history of breast mass with benign biopsy, hyperlipidemia, morbid obesity, peripheral vascular disease, history of MRSA septicemia in the past, recent vaginal bleeding, who presented to [facility] with ongoing vaginal bleeding. She recently left AGAINST MEDICAL ADVICE on 10/13/2012. She was started on medroxyprogesterone at the last admission and was to follow up with Dr. Bonney Aid. It appears that the patient was taking medroxyprogesterone per her report. However, currently she is quite lethargic and slightly difficult to arouse. Her dysarthria makes obtaining history from the patient quite difficult at this point in time. The patient appears to have symptomatic anemia at this point in time. The patient's hemoglobin is currently 6.4. During the last hospitalization, the patient received a blood transfusion. The patient's hemoglobin was as low as 7.4 during the last hospitalization and the patient did undergo partial dialysis treatment and only had 45 minutes left today. She is now admitted as she is lethargic, hypotensive and has anemia of blood loss.   PAST MEDICAL HISTORY:  1.  End-stage renal disease on hemodialysis Tuesday, Thursday and Saturday at the [facility],  followed by [facility] Nephrology.  2.  Diabetes mellitus, type I, since age 50.  3.  Hypertension.  4.  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  5.  Chronic pain syndrome on chronic narcotic therapy.  6.  A history of breast mass with a benign biopsy.  7.  Hyperlipidemia.  8.  Morbid obesity.  9.  Peripheral vascular disease.  10.  A history of MRSA septicemia.  11.  A left upper extremity AV fistula.  12.  Status post cholecystectomy in 2010.   ALLERGIES:  DEMEROL, IBUPROFEN, KEPPRA and TRAMADOL.   MEDICATIONS:  Medications from most recent discharge: 1.  Xanax 2 mg p.o. t.i.d.  2.  Ambien 10 mg p.o. daily.  3.  Atarax 10 mg p.o. q.i.d. p.r.n. itching.  4.  Combivent 1 puff inhaled twice daily.  5.  Neurontin 100 mg p.o. daily.  6.  Plavix 75 mg p.o. daily.  7.  Simvastatin 40 mg p.o. daily.  8.  Nitrostat sublingual 0.4 mg sublingual 5 minutes as needed for chest pain.  9.  Symbicort 160/4.5, 2 puffs inhaled b.i.d.  10.  Protonix 40 mg p.o. daily.  11.  Levemir 45 units subcutaneous b.i.d.  12.  Medroxyprogesterone 10 mg p.o. daily.  SOCIAL HISTORY:  The patient states that she lives independently with her boyfriend. She continues to smoke 2 to 3 cigarettes per day. No illicit drug use.   FAMILY HISTORY:  The patient's mother recently died and had history of end-stage renal disease.   REVIEW OF SYSTEMS:  The patient is lethargic and has dysarthria which make obtaining an accurate review of systems quite difficult.   PHYSICAL EXAMINATION:  VITAL SIGNS:  Temperature 98.4, pulse 103, respirations 20,  blood pressure 78/49, pulse ox 98%.  GENERAL:  Lethargic, obese, African American female.  HEENT:  Normocephalic, atraumatic. Spontaneous extraocular movements are noted. Pupils equal, round, and reactive to light. No scleral icterus. Conjunctivae are pale. No epistaxis noted. Hearing was intact when the patient was awake. Oral mucosa moist.  NECK:   Supple without JVD, lymphadenopathy.  LUNGS:  Clear to auscultation bilaterally with normal respiratory effort.  HEART:  S1, S2. The patient noted to be tachycardic, 2/6 systolic ejection murmur heard.  ABDOMEN:  Obese, soft, nontender, nondistended. Bowel sounds positive. No rebound or guarding. No gross organomegaly appreciated.  EXTREMITIES:  No clubbing, cyanosis, or edema.  NEUROLOGIC:  The patient is lethargic but is arousable. She does follow commands with her right side readily. She has residual left-sided weakness.  MUSCULOSKELETAL:  No joint redness, swelling or tenderness appreciated  GENITOURINARY:  Deferred to GYN.  PSYCHIATRIC:  The patient is lethargic but is arousable.  MUSCULOSKELETAL:  No joint redness, swelling or tenderness appreciated.   LABORATORY DATA:  Sodium 135, potassium 3.8, chloride 95, CO2 34, BUN 21, creatinine 5.6, calcium 7.7, total protein 8.1, albumin 3.3, total bilirubin 0.3, alkaline phosphatase 260, AST 50, ALT 15, CK 264, CK-MB 1.8. Troponin less than 0.02. CBC shows WBC 9.9, hemoglobin 6.4, hematocrit 18, platelets 246, INR 1.1. EKG shows sinus tachycardia   IMPRESSION:  This is a 39 year old African American female with the past medical history of end-stage renal disease on hemodialysis Tuesday, Thursday, and Saturday, diabetes mellitus, type I, hypertension, a history of thalamic cerebrovascular accident with residual left-sided weakness, chronic pain syndrome, hyperlipidemia, morbid obesity, peripheral vascular disease, recent vaginal bleeding who presented to [facility] with ongoing vaginal bleeding, anemia of blood loss, and hypotension.  1.  Vaginal bleeding/menorrhagia. The patient had this issue noted at the last admission. She was prescribed medroxyprogesterone for treatment and was evaluated by Dr. Bonney AidStaebler. GYN is admitting the patient and Dr. Luella Cookosenow will evaluate the patient's vaginal bleeding. She will likely need D and C per  his report. Would certainly attempt to stabilize the patient prior to this being performed. The patient is to be receiving 2 units of packed red blood cells. The patient low to moderate risk for procedure once stabilized.  2.  Anemia of blood loss. The likely source is vaginal bleeding. We will hold the Plavix at present. The patient is receiving IV fluid bolus at present as well as systolic blood pressure was in the 60s while in the Emergency Department. The patient is to be receiving a blood transfusion that was already ordered in the Emergency Department.  3.  End-stage renal disease on hemodialysis Tuesday, Thursday, Saturday. The patient received most of her dialysis treatment today. Would withhold further dialysis treatment for now. Would recommend Nephrology consultation with Dr. Thedore MinsSingh as well.  4.  Diabetes mellitus, type I. We will continue the patient on Levemir and we will also provide the patient with sliding scale insulin.  5.  A history of thalamic cerebrovascular accident with residual left-sided weakness. We will hold Plavix at this point in time given the symptomatic bleeding. Once the bleeding has resolved, would consider re-establishing Plavix.  6.  Hyperlipidemia. Continue simvastatin.  I would like to thank Dr. Luella Cookosenow for this kind referral. Further plan as the patient progresses.   ____________________________ Lennox PippinsMunsoor N. Otis Portal, MD mnl:jm D: 10/15/2012 15:39:37 ET T: 10/15/2012 17:46:07 ET JOB#: [id number]  cc: Lennox PippinsMunsoor N. Meghna Hagmann, MD, <Dictator> Ria CommentMUNSOOR N Jaicey Sweaney MD ELECTRONICALLY SIGNED 11/09/2012  10:48 

## 2014-09-14 NOTE — H&P (Signed)
PATIENT NAME:  Stacy Bean, Stacy Bean MR#:  [id number] DATE OF BIRTH:  Nov 09, 1975  DATE OF ADMISSION:  10/11/2012  PRIMARY CARE PHYSICIAN: Meindert A. Lacie ScottsNiemeyer, MD  EMERGENCY DEPARTMENT REFERRING PHYSICIAN: Malachy Moanevainder Goli, MD  CHIEF COMPLAINT: Generalized weakness, sleepiness.   HISTORY OF PRESENT ILLNESS: The patient is a 39 year old, African American morbidly obese female with a history of end-stage renal disease, type 2 diabetes, hypertension, history of thalamic stroke with chronic left-sided weakness, who  came to the ED yesterday with generalized weakness, fatigue. She was discharged home, returns back with similar type of complaints. The patient is noted to have a hemoglobin of 7.4. To note, her in September was normal. The patient reports that she also since last Tuesday has had significantly heavy menstruations, which she normally does not have heavy menstruations. She also is on multiple sedating medications that could also be causing her to be lethargic and sleepy. She denies any chest pains or shortness of breath or any abdominal pain, nausea, vomiting or diarrhea or fevers. The patient is able to answer questions but is sleepy throughout the evaluation.   PAST MEDICAL HISTORY: 1.  End-stage renal disease.  2.  Diabetes type 1 since age 89.  3.  Hypertension.  4.  History of  thalamic stroke with residual left-sided weakness.  5.  Chronic pain syndrome, on chronic narcotics.  6.  Status post cholecystectomy in 2010.  7.  Breast mass with benign biopsy.  8.  Hyperlipidemia.  9.  Morbid obesity.  10.  Peripheral vascular disease.  11.  History of MRSA septicemia in the past.   ALLERGIES: DEMEROL, IBUPROFEN, KEPPRA AND TRAMADOL.   SOCIAL HISTORY: She currently lives with a friend. She continues to smoke about 2 to 3 cigarettes a day. No alcohol or drug use. She requires intermittent wheelchair for ambulation.   FAMILY HISTORY: Positive for coronary artery disease, diabetes, breast  cancer.   CURRENT MEDICATIONS: Alprazolam 2 mg 1 tab 3 times daily as needed, Ambien 10 mg at bedtime, Atarax 10 mg 4 times a day as needed for itching, Combivent 1 puff b.i.Bean., gabapentin 100 mg 1 tab p.o. daily, MiraLAX 17 grams daily as needed, Nitrostat 0.4 mg sublingual every 5 minutes as needed, NovoLog sliding scale, Percocet 5/325 q.4 to q.6 p.r.n., Plavix 75 p.o. daily, promethazine 25 q.6, Protonix 40 mg 1 tab p.o. b.i.Bean., Renvela 800 mg 3 tabs 3 times a day with meal and 1 tablet with snacks, Sensipar 60 daily, simvastatin 40 at bedtime, Symbicort 160/4.5 mcg 2 puffs b.i.Bean., Tylenol Extra-Strength 500 mg 2 tabs q.6 p.r.n. for pain, Zofran 8 mg q.4 p.r.n. for nausea, Zoloft 100 mg 2 tabs daily. The patient is also on Lantus apparently but dose is not available.   REVIEW OF SYSTEMS:   CONSTITUTIONAL: Denies any fevers. Complains of fatigue, weakness. Has chronic pain. Denies weight loss, weight gain.  EYES: No blurred or double vision. No pain. No redness. No inflammation. No glaucoma. No cataracts.  EARS, NOSE, THROAT: No tinnitus. No ear pain. No hearing loss. No seasonal or year-round allergies. No epistaxis. No nasal discharge. No postnasal drip. No difficulty swallowing.  RESPIRATORY: Denies any cough, wheezing, hemoptysis. No dyspnea. No COPD. No TB. CARDIOVASCULAR: Denies any chest pain, orthopnea, edema or arrhythmia.  GASTROINTESTINAL: No nausea, vomiting, diarrhea. No abdominal pain. No hematemesis. No melena. No guarding. No IBS. No jaundice. No rectal bleeding.  GENITOURINARY: Denies any dysuria, hematuria, renal calculus or frequency. GYNECOLOGIC: Complains of heavy menstruation.  ENDOCRINE: Denies any  polyuria, nocturia or thyroid problems.  HEMATOLOGIC AND LYMPHATIC: No previous history of anemia based on our previous CBC. No easy bruisability or bleeding.  SKIN: No acne. No rash. No changes in mole, hair or skin.  MUSCULOSKELETAL: Denies any pain in the neck, back or shoulder  currently. NEUROLOGIC: Has chronic left-sided weakness due to a CVA. No seizures. No TIA. No memory loss.  PSYCHIATRIC: Denies any anxiety. Does have insomnia. No ADD. No OCD.   PHYSICAL EXAMINATION: VITAL SIGNS: Temperature 98.2, pulse 107, respirations 16, blood pressure 121/57.  GENERAL: The patient is a morbidly obese African American female, appears lethargic but is able to open her eyes and answer questions when questioned.  HEENT:  There is conjunctival pallor. No scleral icterus. Extraocular movements are intact. Nasal exam shows no drainage or ulceration. Oropharynx is clear without any exudates or masses. Ears: There is no external erythema or drainage.  NECK: Supple and symmetric. No masses. Thyroid is midline, not enlarged. No JVD.  RESPIRATORY: No accessory muscle usage. Clear to auscultation bilaterally without any rales, rhonchi or wheezing.  CARDIOVASCULAR: Regular rate and rhythm, tachycardic. No murmurs, clicks or gallops. PMI is not displaced. S1, S2 positive.  GASTROINTESTINAL: There is obese abdomen. No distention. Positive bowel sounds x 4. There is no hepatosplenomegaly.  GENITOURINARY: Deferred.  MUSCULOSKELETAL: There is no erythema or swelling.  SKIN: There is no rash or any other lesions.  LYMPHATIC: No lymph nodes palpable.  VASCULAR: Good DP, PT pulses.  NEUROLOGICAL: The patient is sleepy. Cranial nerves II through XII grossly intact. There is some left-sided weakness. Babinski's downgoing. Reflexes 2+.  PSYCHIATRIC: Not anxious or depressed.   LABORATORY AND RADIOLOGICAL DATA: Glucose 334, BUN 41, creatinine 8.95, sodium 132, potassium 4.1, chloride 94; CO2 is 30. Beta-hCG done yesterday was negative. WBC 7.5, albumin 3.0, bilirubin total 0.2; alkaline phosphatase is 212; AST is 12. Troponin less than 0.02. WBC 6.1, hemoglobin 7.4, platelet count 207. Her hemoglobin yesterday was 9.6. Pelvic ultrasound: Endometrial stripe is not abnormally thickened.   ASSESSMENT  AND PLAN: The patient is a 39 year old African American female with end-stage renal disease on hemodialysis, diabetes, cerebrovascular accident, hyperlipidemia, morbid obesity, presents to the Emergency Department  with generalized weakness, lethargy, also heavy menstruation with  significant hemoglobin decrease.  1.  Lethargy: Likely due to anemia, which is progressive, as well as  multiple sedating medications. At this time, we will hold  Ambien, Atarax, alprazolam and Percocet. We will give her 1 packed unit of RBCs. Transfuse her if needed further.  2.  Anemia: Due to heavy menstruation. The patient is consented for transfusion. I will give her 1 unit of packed RBCs.  3.  Menorrhagia: We will have gynecology evaluate the patient for further recommendations.  4.  End-stage renal disease: Seen by nephrology. If still in the hospital, will require hemodialysis as scheduled.  5.  Diabetes: We will place her on sliding scale as well as continue her home regimen.  6.  History of cerebrovascular accident: Will start on aspirin once  her bleeding is stabilized.  7.  Hyperlipidemia: Continue simvastatin.  8.  Miscellaneous: Will use sequential compression devices  for deep vein thrombosis prophylaxis.   TIME SPENT: 50 minutes spent on H and P.    ____________________________ Lacie Scotts. Allena Katz, MD shp:jm Bean: 10/11/2012 18:05:34 ET T: 10/11/2012 18:37:25 ET JOB#: [id number]  cc: Aser Nylund H. Allena Katz, MD, <Dictator> Charise Carwin MD ELECTRONICALLY SIGNED 10/19/2012 8:37

## 2014-09-14 NOTE — Consult Note (Signed)
CC: diarrhea.  See NP Fransico SettersKim Mills note.  Pt morbidly obese, abd tender in lower abd which she attributes to endometriosis.  Stool studies neg, agree with empiric treatment with levaquin,  Observe for now for any other diarrhea or bleeding.  Electronic Signatures: Scot JunElliott, Kealy Lewter T (MD)  (Signed on 02-Jun-14 20:14)  Authored  Last Updated: 02-Jun-14 20:14 by Scot JunElliott, Franchesca Veneziano T (MD)

## 2014-09-14 NOTE — Consult Note (Signed)
Brief Consult Note: Diagnosis: Right lower quadrant abd pain, diarrhea.   Patient was seen by consultant.   Consult note dictated.   Comments: Oral contrast CT for tomorrow. Last watery stool was Sat> She had one large soft bm yest-none today. Pt is in dialysis, lethargic, speech difficult to understand and is a poor histrorian at this time. Last pain med was 2 hours ago. Case d/w Dr. Mechele CollinElliott in collaboration of care.  Electronic Signatures: Rowan BlaseMills, Derwood Becraft Ann (NP)  (Signed 02-Jun-14 13:36)  Authored: Brief Consult Note   Last Updated: 02-Jun-14 13:36 by Rowan BlaseMills, Beadie Matsunaga Ann (NP)

## 2014-09-14 NOTE — Discharge Summary (Signed)
PATIENT NAME:  Stacy Bean, Stacy Bean MR#:  [id number] DATE OF BIRTH:  1975/08/25  DATE OF ADMISSION:  10/20/2012 DATE OF DISCHARGE:  10/26/2012  PRIMARY CARE PHYSICIAN: [facility].   [facility] and [facility] with [facility].  FINAL DIAGNOSES: 1.  Acute respiratory failure, now chronic. Chronic obstructive pulmonary disease and sleep apnea probably contributing.  2.  Encephalopathy, metabolic versus toxic.  3.  Cocaine in the urine toxicology. 4.  Diarrhea.  5.  End-stage renal disease on Monday, Wednesday, Friday dialysis.  6.  Diabetes with neuropathy.  7.  Depression and anxiety.  8.  Chronic abdominal pain.  9.  History of cerebrovascular accident, bedbound and aphasic.  10.  Chronic obstructive pulmonary disease.  11.  Obesity and sleep apnea.   MEDICATIONS ON DISCHARGE: Include Plavix 75 mg daily, Symbicort 160/4.5 two puffs twice a day, Nitrostat 0.4 mg sublingually every 5 minutes up to 3 doses as needed for chest pain, Sensipar 60 mg daily, Renvela 800 mg 3 tablets 3 times a day with meals and 1 tablet twice a day with snacks, medroxyprogesterone 10 mg daily and finish up course, Protonix 40 mg daily, Zoloft 100 mg daily, EMLA 2.5/2.5 topical cream applied to affected area before dialysis, acetaminophen 325 mg 2 tablets every 4 hours as needed for pain, gabapentin 100 mg 1 capsule once a day and an additional capsule after dialysis, Levemir 35 to 40 units subcutaneous injection at bedtime, cyclobenzaprine 10 mg 3 times a day, Xanax 1 mg every 8 hours as needed for anxiety, Percocet 10/325 one tablet 3 times a day, Ambien 10 mg at bedtime, Abilify 10 mg daily, Combivent CFC 1 puff twice a day, vitamin D2 at 50,000 units once a week, ferrous sulfate 325 mg daily, insulin aspart 6 units subcutaneous injection via FlexPen q.a.c., oxygen 2 liters nasal cannula.   DISCHARGE INSTRUCTIONS:  1.  Renal diet, regular consistency  2.  Dialysis Monday, Wednesday,  Friday. 3.  Need to follow up with your pain management doctor as soon as possible and in 1 to 2 weeks [facility].  4.  Home health physical therapy and nurse.   REASON FOR ADMISSION: The patient was admitted 10/20/2012 and discharged 10/26/2012. The patient was admitted with lethargy and altered mental status.   HISTORY OF PRESENT ILLNESS: The patient is a 39 year old female sent over from dialysis due to lethargy and weakness, snoring during most of the treatment, unarousable and sent over to the ER. The patient was admitted with altered mental status, probably multifactorial secondary to taking quite a bit of Percocet, sleep apnea, hypoxia. The patient was admitted to the hospital for further work-up.   LABORATORY AND RADIOLOGICAL DATA: Included a white blood cell count of 8.0, hemoglobin and hematocrit of 10.6 and 30.2, platelet count of 242. Glucose 221, BUN 32, creatinine 7.56, sodium 131, potassium 4.2, chloride 95, CO2 of 28, calcium 7.9. Liver function tests: Alkaline phosphatase 231, ALT 22, AST 26. She had a pH of 7.39, pCO2 of 54, pO2 of 60, bicarbonate 32.7, O2 saturation 91.8. CT scan of the head showed no acute intracranial hemorrhage, chronic microangiopathy. Urine toxicology positive for cocaine. Urinalysis: 3+ leukocyte esterase, 3+ bacteria. Chest x-ray: Right lung base atelectasis. Stool culture negative. Ova and parasites negative. Stool for C. difficile negative. Repeat chest x-ray showed shallow inspiration, linear atelectasis versus thickening along the minor fissure. Blood cultures negative. Another stool for C. difficile was negative. CT scan of the chest for pulmonary embolism  showed no evidence of acute pulmonary embolism, no mediastinal or hilar lymphadenopathy, bibasilar atelectasis, no pneumonia seen. ABG showed a pH of 7.41, pCO2 of 51, pO2 of 74, bicarbonate 32.9, O2 saturation 94.3, and that was on 28% oxygen. CT scan of the abdomen and pelvis showed normal  appendix, mild anasarca.   HOSPITAL COURSE PER PROBLEM LIST:  1.  For the patient's acute respiratory failure, this is now chronic, likely reflecting the patient's COPD, sleep apnea. Could be pickwickian syndrome. The patient's pulse oximetry dropped down to 83% on room air, making her chronic respiratory failure requiring 2 liters oxygen 24/7. The patient was initially placed on some antibiotics thinking that this could be infection, but the antibiotics were stopped prior to discharge.  2.  Encephalopathy: This could be metabolic versus toxic. The patient did have cocaine in the system, probably not helping out things. Could be secondary to hypoxia. Could have been infection-related or diarrhea-related. The patient did have a history of stroke in the past and aphasia and is always difficult to understand. Upon discharge, she was talking coherently. The patient does take an awful lot of Percocet. She does follow up with pain management in [place]. She needs to follow up there for any further refills.  3.  For the cocaine in the urine, I confronted the patient about this. The patient does not know how it got there. She states that she does not sniff cocaine or smoke crack cocaine.  4.  For the patient's diarrhea, the patient had negative stool studies, negative CAT scan of the abdomen. Seen in consultation by Dr. Mechele Collin. Nothing found on exam. The patient does have chronic abdominal pain secondary to endometriosis.  5.  End-stage renal disease on dialysis Monday, Wednesday, Friday. The patient did have dialysis on Wednesday prior to discharge. Next dialysis will be on Friday.  6.  Diabetes with neuropathy: No changes in medications were made.  7.  Depression and anxiety: She is on numerous medications. If she keeps on coming in with altered mental status, her overall prognosis is going to be poor because she takes too much medication.  8.  Chronic abdominal pain syndrome: She has to follow up with her pain  management specialist and discuss pain management. 9.  Chronic obstructive pulmonary disease: Respiratory status stable. CT scan of the chest was negative. She was kept on her inhalers.  10.  Obesity and sleep apnea: I think the oxygen will help out with sleep apnea.   Overall prognosis poor. Numerous hospital admissions. Insight poor.   TIME SPENT ON DISCHARGE: 40 minutes.    ____________________________ Herschell Dimes. Renae Gloss, MD rjw:jm Bean: 10/26/2012 16:26:01 ET T: 10/26/2012 20:25:50 ET JOB#: [id number]  cc: Herschell Dimes. Renae Gloss, MD, <Dictator> [facility]  Salley Scarlet MD ELECTRONICALLY SIGNED 10/29/2012 15:12

## 2014-09-14 NOTE — Consult Note (Signed)
Brief Consult Note: Diagnosis: Menorrhagia.   Patient was seen by consultant.   Recommend further assessment or treatment.   Orders entered.   Comments: 39 yo morbidly obese female with ESR on dialysis presenting with menorrhagia of 6 days duration - TVUS reviewed no uterine structural abnormalities to explain current episode of menorrhagia, EMS non-thickened at 17m - CT scan from January reviewed, the patient appears to have essure coils in place for tubal ligation - Check TSH, prolactin - PT/PTT - In setting of acute bleeding options include high dose estrogen or high dose progestin.  Favor the later in this patient given her comorbidities risk for thrombosus.  Will start on provera 153mpo daily.   Longterm I recommend the patient consider Mirena IUD which has FDA approval for managment of menorrhagia, delivers drug locally to the uterus, and should not be affected by hemodyalisis.  This would be done outpatient in our clinic, she also is in need of a pap as she has not had one in several years..  Electronic Signatures: StDorthula NettlesMD)  (Signed 21-May-14 10:27)  Authored: Brief Consult Note   Last Updated: 21-May-14 10:27 by StDorthula NettlesMD)

## 2014-09-15 NOTE — H&P (Signed)
PATIENT NAME:  Stacy Bean, Stacy Bean MR#:  [id number] DATE OF BIRTH:  09-Feb-1976  DATE OF ADMISSION:  08/12/2013  PRIMARY CARE PHYSICIAN:  Dr. Evelene Croon.   REFERRING PHYSICIAN:  Dr. Carollee Massed.   CHIEF COMPLAINT:  Altered mental status, hypoxia, hypotension.   HISTORY OF PRESENT ILLNESS:  Stacy Bean is a 39 year old female with a history of multiple medical problems including end-stage renal disease on hemodialysis, morbid obesity, chronic pain medications, history of CVA, diabetes mellitus, received a call to the Emergency Department that the patient is altered mental status, hypotensive, hypoxic at the dialysis center.  No attendings were presented with the patient.  When the EMS arrived the patient was in altered mental status with a systolic blood pressure in the 70s.  The patient was brought to the Emergency Department.  The patient received 500 ml of bolus and vancomycin IV.  The patient woke up while in the Emergency Department, was able to answer the questions; however continued to be somnolent.  The patient's systolic blood pressure improved to 102.  The patient states that the patient's blood pressure drops after the dialysis and stays in the 70s.  The patient is found to have mild elevation of the lactic acid of 2.1.  Work-up in the Emergency Department, CT head without contrast, no acute intracranial abnormality.  ABG, pH of 7.35, pCO2 of 52 and pO2 of 76.  Normal white blood cell count.  Chest x-ray, low lung volumes.  No acute findings were noted.   PAST MEDICAL HISTORY: 1. End-stage renal disease on hemodialysis, Tuesday, Thursday and Saturday schedule.  2. Peripheral vascular disease.  3. Hyperlipidemia.  4. Diabetes mellitus.  5. History of DVT.  6. Recent history of CVA.  7. History of recent hip fracture.  8. Anemia of chronic disease.  9. Morbid obesity.  10. Hypertension.  11. Endometriosis.  12. Continued tobacco use. 13. Depression.  PAST SURGICAL HISTORY: 1.   Partial hysterectomy.  2.  Tubal ligation.  3.  Cholecystectomy.  4.  D and C. 5.  C-section.   ALLERGIES:  1.  IBUPROFEN.  2.  KEPPRA.  3.  TRAMADOL.  4.  DEMEROL.   HOME MEDICATIONS: 1. Ambien 10 mg once a day.  2. Vitamin D2 50,000 units once a week.  3. Symbicort two puffs two times a day.  4. Sertraline 100 mg once a day. 5. Sensipar 60 mg once a day. 6. Renvela 800 mg three tablets three times a day. 7. Protonix 40 mg once a day. 8. Plavix 75 mg once a day. 9. Percocet 10/325 mg two tablets three times a day.  10. NovoLog Flex Pen 10 to 12 units.  11. Nitrostat every five minutes as needed.  12. Levemir 45 units 2 times a day.  13. Gabapentin 100 mg once a day.  14. Ferrous sulfate 325 mg once a day.  15. Combivent 1 puff 2 times a day.  16. Carisoprodol 350 mg three times a day. 17. Alprazolam 1 mg every 8 hours.  18. Acetaminophen 650 mg every four hours as needed.  19. Abilify 10 mg once a day.  SOCIAL HISTORY:  Continues to smoke three to four cigarettes a day.  Denies drinking alcohol, using any illicit drugs.  Lives with a friend.  Has two children who are 81 and 17 live with her aunt.  The patient states usually bed and wheelchair bound.   FAMILY HISTORY:  Multiple family members with polycystic kidney disease.  REVIEW OF SYSTEMS:  The patient was able to give some history, however it was very difficult to understand.  Reviewed all the systems, however accuracy is uncertain, considering the patient quite being somnolent and altered mental status.   PHYSICAL EXAMINATION: GENERAL:  This is a well-built, well-nourished, obese female lying down in the bed, not in distress.  VITAL SIGNS:  Temperature 97.7, pulse 74, blood pressure 98/52, respiratory rate of 16, oxygen saturation is 94% on 2 liters of oxygen.  HEENT:  Head normocephalic, atraumatic.  Eyes, no scleral icterus.  Conjunctivae normal.  Pupils equal and react to light.  Mucous membranes, mild dryness.  No  pharyngeal erythema.  Could not examine the oropharynx.  NECK:  Supple.  No lymphadenopathy.  No JVD.  No carotid bruit.  No thyromegaly.  CHEST:  Has no focal tenderness.  Decreased breath sounds in the lower lobes.  HEART:  S1 and S2 regular.  No murmurs are heard.  ABDOMEN:  Bowel sounds plus.  Soft, nontender, nondistended.  No hepatosplenomegaly.  EXTREMITIES:  No pedal edema.  Pulses 2+.  NEUROLOGIC:  The patient is arousable, however quickly falling asleep, oriented to self and place.  SKIN:  No rash or lesions.  MUSCULOSKELETAL:  Has good range of motion in all the extremities.  NEUROLOGIC:  I could not examine the motor and sensory.   LABORATORY DATA:  CBC is completely within normal limits.  CMP:  BUN 21, creatinine of 5.48, the rest of all the values are within normal limits.  Troponin less than 0.02.  CT head without contrast:  No acute intracranial abnormality.   ASSESSMENT AND PLAN:  Stacy Bean is a 39 year old female on multiple sedative medications, is brought to the Emergency Department with altered mental status, hypotensive.  1. Altered mental status, very highly concerning about sedative medications.  We will hold all sedative medications and follow up.  2. Hypotension.  The patient states that after dialysis the patient becomes hypotensive.  The patient also has elevated lactic acid.  We will treat it as an infection until we rule out.  Continue with vancomycin and Zosyn.  Follow up with the blood cultures.  The patient does not have any elevated white blood cell count or fever.  3. End stage renal disease.  The patient received hemodialysis today.  Next schedule is on Tuesday.  4. Hypertension, not on any medications.  5. Keep the patient on deep vein thrombosis prophylaxis with heparin.   ____________________________ Susa GriffinsPadmaja Markez Dowland, MD pv:ea D: 08/13/2013 01:11:00 ET T: 08/13/2013 03:38:07 ET JOB#: [id number]  cc: Susa GriffinsPadmaja Carver Murakami, MD, <Dictator> Clerance LavPADMAJA Grete Bosko  MD ELECTRONICALLY SIGNED 09/07/2013 0:21

## 2014-09-15 NOTE — Consult Note (Signed)
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   Patient was seen by consultant.   Recommend further assessment or treatment.   Orders entered.   Comments: MRI hip ordered. patchy appearance to hip with narrowed joint space, severe pain with passive ROM.  Electronic Signatures: Leitha SchullerMenz, Isabeau Mccalla J (MD)  (Signed 08-Feb-15 16:21)  Authored: Brief Consult Note   Last Updated: 08-Feb-15 16:21 by Leitha SchullerMenz, Karla Pavone J (MD)

## 2014-09-15 NOTE — H&P (Signed)
PATIENT NAME:  Stacy Bean, Stacy Bean MR#:  [id number] DATE OF BIRTH:  [birth year]  DATE OF ADMISSION:  08/13/2013  PRIMARY CARE PHYSICIAN:  Dr. Evelene CroonMeindert Niemeyer.   REFERRING PHYSICIAN:  Dr. Carollee MassedKaminski.   CHIEF COMPLAINT:  Altered mental status, hypoxia, hypotension.   HISTORY OF PRESENT ILLNESS:  Stacy Bean is a 39 year old female with a history of multiple medical problems including end-stage renal disease on hemodialysis, morbid obesity, chronic pain medications, history of CVA, diabetes mellitus, received a call to the Emergency Department that the patient is altered mental status, hypotensive, hypoxic at the dialysis center.  No attendings were presented with the patient.  When the EMS arrived the patient was in altered mental status with a systolic blood pressure in the 70s.  The patient was brought to the Emergency Department.  The patient received 500 ml of bolus and vancomycin IV.  The patient woke up while in the Emergency Department, was able to answer the questions; however continued to be somnolent.  The patient's systolic blood pressure improved to 102.  The patient states that the patient's blood pressure drops after the dialysis and stays in the 70s.  The patient is found to have mild elevation of the lactic acid of 2.1.  Work-up in the Emergency Department, CT head without contrast, no acute intracranial abnormality.  ABG, pH of 7.35, pCO2 of 52 and pO2 of 76.  Normal white blood cell count.  Chest x-ray, low lung volumes.  No acute findings were noted.   PAST MEDICAL HISTORY: 1.  End-stage renal disease on hemodialysis, Tuesday, Thursday and Saturday schedule.  2.  Peripheral vascular disease.  3.  Hyperlipidemia.  4.  Diabetes mellitus.  5.  History of DVT.  6.  Recent history of CVA.  7.  History of recent hip fracture.  8.  Anemia of chronic disease.  9.  Morbid obesity.  10.  Hypertension.  11.  Endometriosis.  12.  Continued tobacco use. 13.  Depression.  PAST SURGICAL  HISTORY: 1.  Partial hysterectomy.  2.  Tubal ligation.  3.  Cholecystectomy.  4.  Bean and C. 5.  C-section.   ALLERGIES:  1.  IBUPROFEN.  2.  KEPPRA.  3.  TRAMADOL.  4.  DEMEROL.   HOME MEDICATIONS: 1.  Ambien 10 mg once a day.  2.  Vitamin D2 50,000 units once a week.  3.  Symbicort two puffs two times a day.  4.  Sertraline 100 mg once a day. 5.  Sensipar 60 mg once a day. 6.  Renvela 800 mg three tablets three times a day. 7.  Protonix 40 mg once a day. 8.  Plavix 75 mg once a day. 9.  Percocet 10/325 mg two tablets three times a day.  10.  NovoLog Flex Pen 10 to 12 units.  11.  Nitrostat every five minutes as needed.  12.  Levemir 45 units 2 times a day.  13.  Gabapentin 100 mg once a day.  14.  Ferrous sulfate 325 mg once a day.  15.  Combivent 1 puff 2 times a day.  16.  (Dictation Anomaly) <<MISSING TEXT>> times a day. 17.  Alprazolam 1 mg every 8 hours.  18.  Acetaminophen 650 mg every four hours as needed.  19.  Abilify 10 mg once a day.  SOCIAL HISTORY:  Continues to smoke three to four cigarettes a day.  Denies drinking alcohol, using any illicit drugs.  Lives with a friend.  Has two children who are [id number]  and 17 live with her aunt.  The patient states usually bed and wheelchair bound.   FAMILY HISTORY:  Multiple family members with polycystic kidney disease.  REVIEW OF SYSTEMS:  The patient was able to give some history, however it was very difficult to understand.  Reviewed all the systems, however accuracy is uncertain, considering the patient quite being somnolent and altered mental status.   PHYSICAL EXAMINATION: GENERAL:  This is a well-built, well-nourished, obese female lying down in the bed, not in distress.  VITAL SIGNS:  Temperature 97.7, pulse 74, blood pressure 98/52, respiratory rate of 16, oxygen saturation is 94% on 2 liters of oxygen.  HEENT:  Head normocephalic, atraumatic.  Eyes, no scleral icterus.  Conjunctivae normal.  Pupils equal and react to  light.  Mucous membranes, mild dryness.  No pharyngeal erythema.  Could not examine the oropharynx.  NECK:  Supple.  No lymphadenopathy.  No JVD.  No carotid bruit.  No thyromegaly.  CHEST:  Has no focal tenderness.  Decreased breath sounds in the lower lobes.  HEART:  S1 and S2 regular.  No murmurs are heard.  ABDOMEN:  Bowel sounds plus.  Soft, nontender, nondistended.  No hepatosplenomegaly.  EXTREMITIES:  No pedal edema.  Pulses 2+.  NEUROLOGIC:  The patient is arousable, however quickly falling asleep, oriented to self and place.  SKIN:  No rash or lesions.  MUSCULOSKELETAL:  Has good range of motion in all the extremities.  NEUROLOGIC:  I could not examine the motor and sensory.   LABORATORY DATA:  CBC is completely within normal limits.  CMP:  BUN 21, creatinine of 5.48, the rest of all the values are within normal limits.  Troponin less than 0.02.  CT head without contrast:  No acute intracranial abnormality.   ASSESSMENT AND PLAN:  Stacy Bean is a 39 year old female on multiple sedative medications, is brought to the Emergency Department with altered mental status, hypotensive.  1.  Altered mental status, very highly concerning about sedative medications.  We will hold all sedative medications and follow up.  2.  Hypotension.  The patient states that after dialysis the patient becomes hypotensive.  The patient also has elevated lactic acid.  We will treat it as an infection until we rule out.  Continue with vancomycin and Zosyn.  Follow up with the blood cultures.  The patient does not have any elevated white blood cell count or fever.  3.  End stage renal disease.  The patient received hemodialysis today.  Next schedule is on Tuesday.  4.  Hypertension, not on any medications.  5.  Keep the patient on deep vein thrombosis prophylaxis with heparin.   ____________________________ Susa Griffins, MD pv:ea Bean: 08/13/2013 01:11:24 ET T: 08/13/2013 03:38:07  ET JOB#: [id number]  cc: Susa Griffins, MD, <Dictator>

## 2014-09-15 NOTE — Consult Note (Signed)
PATIENT NAME:  Stacy Bean, Stacy Bean MR#:  [id number] DATE OF BIRTH:  10/09/1975  DATE OF CONSULTATION:  08/18/2013  REFERRING PHYSICIAN:  Hope Pigeon. Elisabeth Pigeon, MD CONSULTING PHYSICIAN:  Stann Mainland. Sampson Goon, MD CONSULTING NEPHROLOGIST: Mosetta Pigeon, MD  REASON FOR CONSULTATION: Hypotension, possible sepsis, and decubitus ulcer.   HISTORY OF PRESENT ILLNESS: This is an unfortunate 39 year old female with history of end-stage renal disease on hemodialysis through a left forearm fistula, type 1 diabetes since age 26, prior CVA, hypertension, chronic pain, morbid obesity, and peripheral vascular disease, who was admitted March 22 with some altered mental status. She was found to be hypotensive and hypoxic. This was at dialysis center. The patient was brought to the Emergency Room where she was given IV fluids and seemed to become more alert. Blood pressure was noted to be low there but did respond to the IV fluids. The patient was admitted for further treatment. Since admission, the patient's blood pressure has continued to be low. She has been covered with broad-spectrum antibiotics. She was noted to have sacral decubs and is being followed by wound care. Blood cultures have been negative. Cultures of her wounds are growing MRSA and Candida. We are consulted for further assistance in management with a question of whether she is truly septic.   PAST MEDICAL HISTORY: 1.  End-stage renal disease.  2.  Diabetes type 1 since age 42.  3.  Prior CVA.  4.  Peripheral vascular disease.  5.  Hyperlipidemia.  6.  History of DVT.  7.  History of hip fracture.  8.  Anemia of chronic disease.  9.  Morbid obesity.  10.  Hypertension.  11.  Endometriosis.  12.  Tobacco abuse.  13.  Depression.   PAST SURGICAL HISTORY: Hysterectomy, tubal ligation, cholecystectomy, D and C, C-section.   SOCIAL HISTORY: The patient is a resident at a nursing home. She does continue to smoke. She has 2 teenage children.    FAMILY HISTORY: Positive for polycystic kidney disease.   REVIEW OF SYSTEMS: Eleven systems reviewed and negative except as per HPI.   PHYSICAL EXAMINATION:  VITAL SIGNS: The patient has been afebrile since admission. Blood pressure has been running 90s to 120s over 50s to 80s. Pulse is 88, respirations 18, sat 94% on 2 liters.  GENERAL: She is morbidly obese. She is on dialysis at this time and in no acute distress.  HEENT: Pupils equal, round, and reactive to light and accommodation. Extraocular movements are intact. Her sclerae are anicteric. Her oropharynx is clear.  NECK: Supple.  HEART: Distant but regular.  LUNGS: Clear.  ABDOMEN: Obese, soft, nontender.  EXTREMITIES: She has an AV fistula in her left forearm. This appears without any tenderness or drainage. Extremities have 1+ edema bilaterally.  SKIN: She has a stage II decubitus ulcer on her right buttock area. This has some mild drainage but is otherwise intact.   LABORATORY DATA: Blood cultures, March 21: Negative x 2. Wound culture, March 25, grew MRSA and heavy growth Candida. The MRSA was sensitive to clindamycin, gentamicin, vancomycin, tigecycline, linezolid, and Bactrim. White blood count on admission was 7.6. It is not elevated at all currently, was 4.0 yesterday, hemoglobin 12.7, platelets 173.   IMAGING: Chest x-ray, March 21: Low lung volumes, otherwise negative. CT of the chest was negative for PE. CT of the head was negative.   IMPRESSION AND PLAN: A 39 year old with end-stage renal disease on hemodialysis, diabetes, morbid obesity, admitted with altered mental status and hypotension. She has  been treated with broad-spectrum antibiotics. Blood cultures are negative, but wound cultures growing methicillin-resistant Staphylococcus aureus and Candida. The wounds are being treated locally.   I do not suspect she is septic or has overwhelming bacterial infection. I think her hypotension is likely related more to volume and  dialysis. She does have some mild infection in her decubitus ulcers, but I would recommend continuing wound care and just treating it with a short course of oral antibiotics for the methicillin-resistant Staphylococcus aureus. This can be accomplished with doxycycline 100 mg twice a day for a 7-day course.   I have discontinued her vancomycin, levofloxacin, and fluconazole. Fungal infection can be treated with local wound care.   Thank you for the consult. I will be glad to follow with you.   ____________________________ Stann Mainlandavid P. Sampson GoonFitzgerald, MD dpf:jcm D: 08/18/2013 16:09:27 ET T: 08/18/2013 16:57:08 ET JOB#: [id number]  cc: Stann Mainlandavid P. Sampson GoonFitzgerald, MD, <Dictator> DAVID Sampson GoonFITZGERALD MD ELECTRONICALLY SIGNED 08/26/2013 13:29

## 2014-09-15 NOTE — H&P (Signed)
PATIENT NAME:  Stacy Bean, Stacy Bean MR#:  [id number] DATE OF BIRTH:  1976/03/31  DATE OF ADMISSION:  05/26/2013  PRIMARY CARE PHYSICIAN:  Dr. Lacie ScottsNiemeyer.  CHIEF COMPLAINT: Back pain, sore on the back.   HISTORY OF PRESENT ILLNESS: This is a 39 year old female with multiple medical issues. She was sent in from Dr. Carron BrazenNiemeyer's office for possible gangrenous lesion on her back. She has a sore on the back going on for 4 weeks. ER physician does not think that this is gangrenous. The patient has a normal white count here. Today is the patient's day of dialysis. She is more lethargic today and slurring her speech. The patient states that she normally sleeps a this time of day.  She was up all night and that is why she is tired. Looking back at last hospital stay, I did send the patient home with oxygen secondary to decreased oxygen saturation. She states she only wears oxygen at night. Hospitalist services were contacted for further evaluation secondary to lethargy and today being her dialysis today. The patient does answer questions but difficult to understand. No complaints of weakness one side or another.   PAST MEDICAL HISTORY: End-stage renal disease on dialysis, left arm fistula, diabetes, oxygen at night, heart disease, probable Pickwickian syndrome, sleep apnea secondary to obesity.   PAST SURGICAL HISTORY: Left arm, C-section, cholecystectomy, tubal ligation.   ALLERGIES: DEMEROL, IBUPROFEN, KEPPRA, TRAMADOL  MEDICATION: Include Abilify 10 mg daily, acetaminophen 325 mg 2 tablets every 4 hours as needed for pain, acetaminophen hydrocodone 325/7.5 one tablets every 6 hours as needed for pain, acetaminophen oxycodone 10 mg 325 two tablets 3 times a day, Xanax 1 mg every 8 hours as needed for anxiety, soma 350 mg 3 times a day, Combivent 1 inhalation twice a day, ferrous sulfate 325 mg daily, gabapentin 100 mg daily, Levemir 45 units subcutaneous twice a day, Nitrostat p.r.n., NovoLog FlexPen 10 to 12 units  3 times a day, Plavix 75 mg daily, Protonix 40 mg daily, Renvela  2400mg   mg 3 times a day, Sensipar 60 mg daily, Zoloft 100 mg daily, Symbicort 160/4.5,  two puffs twice a day, vitamin D2 50,000 units weekly, Ambien 10 mg at bedtime.    REVIEW OF SYSTEMS: CONSTITUTIONAL: No fever, chills, or sweats. Positive for fatigue. No weight gain. No weight loss.  EYES: No blurry vision.  EARS, NOSE, MOUTH AND THROAT: No hearing loss. No sore throat. No difficulty swallowing.  CARDIOVASCULAR: No chest pain. No palpitations.  RESPIRATORY: No shortness of breath. No cough. No sputum. No hemoptysis.  GASTROINTESTINAL: Positive for abdominal pain. No nausea. No vomiting. No diarrhea. No constipation. No bright red blood per rectum.  GENITOURINARY: No urination.  MUSCULOSKELETAL: Positive for back pain.  INTEGUMENTARY: Positive for decubiti on back.  NEUROLOGIC: No fainting or blackouts.  PSYCHIATRIC: Positive for depression and anxiety.  ENDOCRINE: No thyroid problems.  HEMATOLOGIC AND LYMPHATIC: No anemia.   PHYSICAL EXAMINATION: VITAL SIGNS: Temperature 98.2, pulse 97, respirations 20, blood pressure 118/60 pulse ox 91% on room air.  GENERAL: No respiratory distress.  EYES: Conjunctivae and lids normal. Pupils equal, round, and reactive to light. Extraocular muscles intact. No nystagmus.  EARS, NOSE, MOUTH AND THROAT: Tympanic membranes: No erythema. Nasal mucosa: No erythema. Throat: No erythema. No exudate seen. Lips and gums: No lesions.  NECK: No JVD. No bruits. No lymphadenopathy. No thyromegaly. No thyroid nodules palpated.  RESPIRATORY:  Lungs clear to auscultation. No use of accessory muscles to breathe. No rhonchi, rales, or  wheeze heard.  CARDIOVASCULAR: S1, S2 normal. No gallops, rubs or murmurs heard. Carotid upstroke 2+ bilaterally. No bruits. Dorsalis pedis pulses 1+ bilaterally, 3+ edema bilateral lower extremity.  ABDOMEN: Soft, nontender. No organomegaly/splenomegaly. Normoactive bowel  sounds. No masses felt.  LYMPHATIC: No lymph nodes in the neck.  MUSCULOSKELETAL: No clubbing, no cyanosis, 2+ edema.  SKIN: Estimated 8 x 4 stage II decubiti on back. No signs of infection.  NEUROLOGIC: Cranial nerves II through XII grossly intact. Deep tendon reflexes 1+ bilateral lower extremity, moving all extremities. Power 5/5 upper and lower extremities. Speech is slurred difficult to understand.  PSYCHIATRIC: The patient is oriented to person and place and answers questions appropriately but speech is slurred difficult to understand.   LABORATORY AND RADIOLOGICAL DATA: White blood cell count 4.6, hemoglobin and hematocrit of 13.1 and 41.0, platelet count 187. Glucose 474, BUN 59, creatinine 10.76, sodium 125, potassium 5.3, chloride 90, CO2 of 27, calcium 7.9. Alkaline phosphatase 417. Other liver function tests: Total protein elevated at 8.5, AST low at 14, ALT 15, albumin 3.2. Ethanol level less than 3.   ASSESSMENT AND PLAN: 1.  Acute encephalopathy, will send off a serum drug screen. The patient seems overmedicated. Will send off a Tylenol level since the patient seems to be taking a lot of Percocet. We will get an ABG because I do think the patient required oxygen 24/7 last time. She is probably a CO2 retainer with Pickwickian syndrome. I will also get a CT scan of the head. The patient believes that her lethargy and tiredness is secondary to she was up all night and she normally sleeps at this time.  2.  End-stage renal disease on hemodialysis. I spoke with Dr. Thedore Mins who will dialyze her here since the potassium is slightly elevated.  3.  Decubiti in the back. This is not infected. White count normal. No fever. No need for antibiotics. We will give a DuoDERM and the wound care consultation.  4.  Diabetes with elevated sugar. Continue Lantus and sliding scale.  5.  Chronic obstructive pulmonary disease.  Pickwickian syndrome.  Must lose weight. The patient probably has sleep apnea also.   6.  Coronary artery disease on Plavix.  7.  Tobacco abuse. Smoking cessation done 3 minutes by me. Nicotine patch applied.   TIME SPENT ON PATIENT CARE: 55 minutes.    ____________________________ Herschell Dimes. Renae Gloss, MD rjw:dp Bean: 05/26/2013 13:37:48 ET T: 05/26/2013 13:52:00 ET JOB#: [id number]  cc: Herschell Dimes. Renae Gloss, MD, <Dictator> Salley Scarlet MD ELECTRONICALLY SIGNED 05/26/2013 16:51

## 2014-09-15 NOTE — H&P (Signed)
PATIENT NAME:  Stacy Bean, AWAD MR#:  [id number] DATE OF BIRTH:  1975-06-30  DATE OF ADMISSION:  07/01/2013  PRIMARY CARE PHYSICIAN:  Dr. Lacie Scotts.   HISTORY OF PRESENT ILLNESS:  The patient is a 39 year old African American female with past medical history significant for history of end-stage renal disease, history of COPD, diabetes mellitus, obesity, pickwickian syndrome, who was brought from home to the hospital by EMS with complaints of left hip as well as back pains.  She was telling me that she was in a wheelchair.  She was trying to get up from the wheelchair and go to the bed.  She just slid down on the bed and started having pains in the left hip; however, there was no trauma and she did not fall.  The pain is in the left hip radiating down to the left knee area and the patient however not able to provide much more history.  She told the Emergency Room physician that this pain is worse whenever she gets up and it is somewhat better whenever she lays down on the left side.  She was also apparently nauseated and vomited bilious material here in the Emergency Room.  X-ray of her abdomen was done which revealed ileus versus a small bowel obstruction and hospitalist services were contacted for admission.  The patient herself admits of abdominal pain in the right abdomen; however, not able to provide much more history about that.  She told me that she was able to eat yesterday, however did not eat much today.  The patient gets hemodialysis at HD center with [facility].  PAST MEDICAL HISTORY:  Significant for history of encephalopathy which was felt to be due to opiates, admission for the same from 05/26/2013 to 05/27/2013, end stage renal disease on hemodialysis, sacral ulcer, COPD, diabetes mellitus, obesity, coronary artery disease, pickwickian syndrome.  Also, left arm fistula.  The patient has been using oxygen at night apparently, has obstructive sleep apnea due to obesity.   PAST SURGICAL HISTORY:  Left  arm fistula as mentioned above, C-section and cholecystectomy as well as tubal ligation.   ALLERGIES:  DEMEROL, IBUPROFEN, KEPPRA AS WELL AS TRAMADOL.   MEDICATIONS:  According to medical records, the patient is on Abilify 10 mg by mouth daily, acetaminophen 325 mg 2 tablets every four hours as needed, alprazolam 1 mg every eight hours as needed, Soma 350 mg by mouth 3 times daily, Combivent Respimat 1 puff twice daily.  Iron sulfate 325 mg by mouth once daily, gabapentin 100 mg by mouth daily and additional 1 capsule after dialysis, Levemir 45 units subcutaneously twice daily, Nitrostat 0.4 mg sublingually every five minutes as needed.  NovoLog sliding scale.  Percocet 10/325 mg 2 tablets 3 times daily, Plavix 75 mg by mouth daily, Protonix 40 mg by mouth daily, Renvela 800 mg three tablets with meals and also 1 tablet twice daily with snacks.  Sensipar 60 mg by mouth daily.  Sertraline 1 mg by mouth daily, Symbicort 160/4.5 2 puffs twice daily.  Vitamin D2 50,000 units 1 capsule once weekly.  Ambien 10 mg by mouth at bedtime.   REVIEW OF SYSTEMS:  Very difficult to obtain due to patient's medical condition.  She admits of having some abdominal pains, feeling chilly, has nausea, vomiting.  She tells me that she her nausea, vomiting is intermittent and comes almost daily, she however was able to eat yesterday and she vomited today.  She admits of having severe left hip area leg pain and  right-sided abdominal pain over the past two days.  I am not able to get much more history from her; however, due to her condition.    PHYSICAL EXAMINATION:  VITAL SIGNS:  On arrival to the Emergency Room, temperature is 97.8, pulse was 93, respiration rate was 18, blood pressure 134/71, saturation was 95% on room air.  GENERAL:  This is a well-developed, well-nourished, obese African American female in moderate distress, lying on the stretcher.  Her eyes are closed all the time.  She is very sleepy.  You have to shake her  in order to get her open eyes and somewhat converse with you.  Her speech is also very slurry and difficult to understand.  HEENT:  Her pupils are equal, reactive to light.  Extraocular movements intact.  No icterus or conjunctivitis.  The patient does have rotational nystagmus.  No conjunctivitis.  Has normal hearing.  No pharyngeal erythema.  Mucosa is moist.   NECK:  No masses.  Supple, nontender.  Thyroid is not enlarged.  No adenopathy.  No JVD or carotid bruits bilaterally.   Full range of motion.  LUNGS:  Clear to auscultation, however very poor respiratory effort.  No rales, rhonchi or wheezing.  No labored inspirations, increased effort, dullness to percussion or overt respiratory distress.  CARDIOVASCULAR:  S1, S2 appreciated.  No murmurs, rubs or gallops noted.  PMI not lateralized.  Chest is nontender to palpation. +1 pedal pulses.  No lower extremity edema, calf tenderness or cyanosis.  ABDOMEN:  Soft, tender in the right upper quadrant, some rebound was felt in the right lower quadrant.  No masses.  No hepatosplenomegaly were noted.  RECTAL:  Deferred.  MUSCLE STRENGTH:  Not able to assess since the patient is not cooperating with exam.  I was trying to passively move her left leg and she screened and then did not allow me to move more.  She is tender on palpation of her left thigh, but I am able to see any other abnormalities.  In fact, no significant swelling on the side of the left thigh was appreciated.  SKIN:  Did not reveal any rashes, lesions, erythema, nodularity or induration.  It was warm and dry to palpation.  LYMPHATIC:  No adenopathy in the cervical region.  NEUROLOGIC:  Cranial nerves grossly intact, but very difficult to examine.  She has some weakness of her facial muscles and her speech is dysarthric.  She is very somnolent, difficult to arouse.  Not able to assess her orientation.  She is not cooperative.   LABORATORY AND RADIOLOGICAL DATA:  BMP revealed glucose of 307,  BUN and creatinine were 43 and 10.55, sodium 126, potassium 5.1; otherwise BMP was unremarkable.  Liver enzymes:  Albumin level of 2.9, alkaline phosphatase 389, otherwise unremarkable.  CBC:  White blood cell count was 5.4.  Hemoglobin 12.5, platelet count 183.  Absolute neutrophil count was normal at 3.7.  No urinalysis.   RADIOLOGIC STUDIES:  Left hip complete x-ray 07/01/2013 revealed no acute abnormality.  Left femur 07/01/2013 showed no acute fracture or dislocation.  Three-way abdominal x-ray including PA of chest 07/01/2013 revealed stable, borderline cardiomegaly, mild pulmonary vascular congestion and mild chronic interstitial lung disease.  Minimal proximal jejunal ileus or partial obstruction according to radiology.  Doppler ultrasound of left lower extremity 07/01/2013 was no evidence of deep vein thrombosis in the left leg.   ASSESSMENT AND PLAN: 1.  Ileus versus a small bowel obstruction.  We will keep patient nothing by  mouth.  We will also give her some IV fluids unless her oxygenation deteriorates.  We will ask surgery to see the patient in consultation.  We will place NG tube if vomiting recurs.  2.  Altered mental status.  We will get ABG stat.  We will place on BiPAP if needed.  3.  End stage renal disease, missed hemodialysis yesterday.  We will ask nephrology to see patient.   4.  Left leg pain of unclear etiology at this time, painful thigh as well, but screams when leg is lifted up and it is unclear if it is actually hip related at all, but she holds her hip whenever the leg is elevated.  Evaluation is very limited; however, we will get orthopedic surgeon involved for further recommendations.   TIME SPENT:  50 minutes.     ____________________________ Katharina Caperima Korver Graybeal, MD rv:ea D: 07/01/2013 16:20:44 ET T: 07/01/2013 17:15:27 ET JOB#: [id number]  cc: Katharina Caperima Woodrow Drab, MD, <Dictator> Meindert A. Lacie ScottsNiemeyer, MD Katharina CaperIMA Aquila Menzie MD ELECTRONICALLY SIGNED 07/26/2013 20:34

## 2014-09-15 NOTE — Discharge Summary (Signed)
PATIENT NAME:  Stacy Bean, Stacy Bean MR#:  [id number] DATE OF BIRTH:  07-22-1975  DATE OF ADMISSION:  05/26/2013 DATE OF DISCHARGE:  05/27/2013  PRIMARY CARE PHYSICIAN: Meindert A. Lacie ScottsNiemeyer, MD   DISCHARGE DIAGNOSES: 1.  Acute encephalopathy, possibly related to narcotics.  2.  End-stage renal disease, on dialysis.  3.  Back ulcer.  4.  Chronic obstructive pulmonary disease.  5.  Diabetes.  6.  Morbid obesity. 7.  Coronary artery disease.  8.  Pickwickian syndrome.   CONDITION: Stable.   CODE STATUS: Full code.   HOME MEDICATIONS: [facility] instruction medication reconciliation list.   DISCHARGE INSTRUCTIONS: The patient will need wound care at home. We will resume patient's home health.   DIET: Low-sodium, low-fat, low-cholesterol, ADA diet.   ACTIVITY: As tolerated.   FOLLOWUP CARE: Follow up with PCP within 1 to 2 weeks.   REASON FOR ADMISSION: Back pain, sore on the back.   HOSPITAL COURSE: The patient is a 39 year old African American female with a history of ESRD on dialysis, oxygen at night p.r.n., presented to ED with back pain and sore on the back. The patient was more lethargic on dialysis day before this admission, so the patient was sent to ED for further evaluation due to lethargy. For detailed history and physical examination, please refer to the admission note dictated by Dr. Renae GlossWieting. On admission date, the patient's WBC was 4.6, hemoglobin 13.1, platelets 187. Glucose 474, BUN 59, creatinine 10.76, sodium 125, potassium 5.3.  Acute encephalopathy, possibly due to medication overdose. Drug screen was positive for opiates; however, the patient's Tylenol level was negative. After admission, the patient's mental  status has much improved. She got hemodialysis.   The patient continuously complained of back pain due to back ulcer. The patient got wound care and dressing change. Still complains of back pain; however, there are no signs of infection  upon physical examination.   The patient's acute encephalopathy has improved. Her vital signs are stable. She is clinically stable. She will be discharged to home with home health wound care today.   I discussed the patient's discharge plan with the patient, nurse, and Dr. Thedore MinsSingh.   TIME SPENT: About 36 minutes.   ____________________________ Shaune PollackQing Labrenda Lasky, MD qc:jcm Bean: 05/27/2013 16:26:12 ET T: 05/27/2013 16:58:12 ET JOB#: [id number]  cc: Shaune PollackQing Jorgia Manthei, MD, <Dictator> Shaune PollackQING Chisum Habenicht MD ELECTRONICALLY SIGNED 05/28/2013 16:11

## 2014-09-15 NOTE — Discharge Summary (Signed)
PATIENT NAME:  Stacy MayerOCH, Demetris D MR#:  [id number] DATE OF BIRTH:  10/16/1975  DATE OF ADMISSION:  08/12/2013 DATE OF DISCHARGE:  08/19/2013   For a detailed note, please take a look at the history and physical done on admission by Dr. Heron NayVasireddy on admission. Please also take a look at the interim discharge summary done by Dr. Elisabeth PigeonVachhani which covers the extensive part of the hospital course from March 22nd until March 27th. This is just a quick addendum for the date of 08/19/2013.   DIAGNOSES AT DISCHARGE: As follows:  1. Systemic inflammatory response syndrome.  2. Methicillin-resistant Staphylococcus aureus sacral wound infection.  3. Hypotension, which is chronic.  4. Diabetes.  5. End-stage renal disease, on hemodialysis.  6. Metabolic encephalopathy.   DIET: The patient is being discharged on a carb-controlled renal diet.   ACTIVITY: As tolerated.   DISPOSITION: The patient is being discharged to a skilled nursing facility.   DISCHARGE MEDICATIONS:  1. Plavix 75 mg daily.  2. Symbicort 160/4.5 two puffs b.i.d. 3. Sublingual nitroglycerin as needed. 4. Sensipar 60 mg daily.  5. Renvela 800 mg 3 tabs t.i.d. with meals and 1 tab b.i.d. with snacks.  6. Gabapentin 100 mg daily and 1 additional capsule after dialysis.  7. Abilify 10 mg daily. 8. Combivent 1 puff b.i.d.  9. Iron sulfate 325 mg daily. 10. Zolpidem 10 mg at bedtime. 11. Carisoprodol 250 mg t.i.d.  12. NovoLog FlexPen 10 to 12 units t.i.d. with meals. 13. Levemir 45 units b.i.d. 14. Vitamin D2 50,000 international units weekly. 15. Zoloft 100 mg daily. 16. Protonix 40 mg b.i.d. 17. Percocet 10/325 two tabs t.i.d. 18. Xanax 1 mg q.8 hours as needed.  19. Doxycycline 100 mg b.i.d. for 7 days.   BRIEF ADDENDUM: As follows: Again, as mentioned, please look at the detailed interim discharge summary done by Dr. Elisabeth PigeonVachhani, which covers the extensive part of the hospital course.   1. Systemic inflammatory response  syndrome. The exact source of this is unclear, but suspected to be a sacral wound infection. The patient initially was on broad-spectrum IV antibiotics, but has now been tapered off of it. The patient was seen by infectious disease, and they do not think that the patient has an acute sacral decubitus infection but likely colonization. At this point, the patient is just being discharged on oral doxycycline.  2. Shock. This was likely hypotension due to autonomic dysfunction from her being on hemodialysis. Sepsis has now been ruled out. The patient is off vasopressors now. She is on midodrine. Her cortisol levels was normal. She is currently hemodynamically stable and therefore being discharged back to skilled nursing facility.  3. Hypoxia. This was likely related to poor inspiratory effort from her altered mental status. Her CT chest was negative for pulmonary embolism. Her O2 saturations are currently stable.  4. Altered mental status. This is likely metabolic encephalopathy from hypoxia and hypotension. Her mental status is now back to baseline.  5. Diabetes. The patient's blood sugars are stable. She will continue her Levemir and NovoLog with meals.  6. End-stage renal disease, on hemodialysis. The patient gets dialyzed on Monday, Wednesday, Friday. She will resume that schedule upon discharge.   The patient is being discharged to a skilled nursing facility.   TIME SPENT: 45 minutes.   ____________________________ Rolly PancakeVivek J. Cherlynn KaiserSainani, MD vjs:lb D: 08/19/2013 10:56:53 ET T: 08/19/2013 11:12:33 ET JOB#: [id number]  cc: Rolly PancakeVivek J. Cherlynn KaiserSainani, MD, <Dictator> Houston SirenVIVEK J Bowman Higbie MD ELECTRONICALLY SIGNED 08/23/2013 14:51

## 2014-09-15 NOTE — Discharge Summary (Signed)
PATIENT NAME:  Stacy Bean, Stacy Bean MR#:  [id number] DATE OF BIRTH:  1976/02/12  DATE OF ADMISSION:  07/01/2013 DATE OF DISCHARGE:  07/05/2013  PRESENTING COMPLAINT: Left hip pain.   DISCHARGE DIAGNOSES: 1.  Left hip mild trochanteric ascites, improving. The patient needs to continue physical therapy.  2.  Ileus secondary to constipation, improving. The patient tolerating diet.  3.  Altered mental status, resolved.  4.  End-stage renal disease, on hemodialysis.  5.  Morbid obesity.  6.  History of cerebrovascular accident.  7.  Hyperlipidemia.  8.  Type 2 diabetes.   CONDITION ON DISCHARGE: Fair.   CODE STATUS: Full code.   DIET: 1800 calorie, low phosphorus, 2 grams sodium diet, renal diet.   DISCHARGE INSTRUCTIONS:  Continue physical therapy.   FOLLOWUP: With Dr. Rosita Kea, orthopedic in 1 to 2 weeks.   LABORATORY, DIAGNOSTIC AND RADIOLOGICAL DATA:   Phosphorus is 5.8, potassium is 4.9.  2.  H and H is 12.6 and 37.2, white count is 4.9.  3.  Sodium is 128, potassium is 5.2. Albumin is 2.6.  4.  MRI of the left hip showed osseous structures of the left hip abnormal, slight left greater trochanteric bursitis with nonspecific edema.   ORTHOPEDIC CONSULTATION: With Dr. Rosita Kea.  Recommends pain medications as needed and physical therapy.   BRIEF SUMMARY OF HOSPITAL COURSE: Stacy Bean is a 39 year old obese African American female with multiple medical problems including end-stage renal disease on hemodialysis, comes into the Emergency Room from home after she started having:  1.  Acute on chronic left hip pain. The patient denies any fall at home, however, was not able to ambulate much. She has very limited activity at home and is basically helped by her boyfriend to get around. She was started on some oral pain medications with narcotics. She continued to have pain which was interfering with her PT.  Hence, orthopedic consultation with Dr. Rosita Kea was obtained. The patient was found to have  mild left hip greater trochanteric bursitis as noted on MRI of the hip. Pain medications were continued.  Physical therapy saw the patient and recommended rehab, for which the patient will be going to rehab today. 2.  Ileus versus  partial small bowel obstruction, likely due to constipation in the setting of chronic narcotic use. The patient received lactulose had a bowel movement. She is tolerating p.o. diet. We will give her daily bowel regimen.  3.  End-stage renal disease. Hemodialysis was continued.  4.  Type 2 diabetes. The patient is placed back on her Levemir and sliding scale insulin.  5.  History of CVA. The patient is on Plavix.  6.  History of anxiety, depression, on p.r.n. Xanax which is her home medication along with Zoloft which was given.   Hospital stay otherwise remained stable. CODE STATUS: The patient remained a full code.   DISCHARGE MEDICATIONS: 1.  Albuterol 1 puff q.i.d.  2.  Symbicort 150/4.5 two puffs b.i.d.  3.  Insulin, Levemir 45 units subcutaneous b.i.d.  4.  Sliding scale insulin.  5.  Insulin, aspart 10 units t.i.d. a.c.  6.  MiraLax 17 grams p.o. b.i.d.  7.  Tylenol 650 q.4 p.r.n.  8.  Vitamin D2 50,000 international units every week on Fridays.  9.  Ferrous sulfate 325 mg p.o. daily.  10.  Sensipar 60 mg daily.  11.  Renvela 2400 mg t.i.d.  12.  Soma 350 mg p.o. t.i.d.  13.  Lactulose 30 mL q.i.d. till the patient has  good BMs.  14.  Percocet 10/325 mg 2 tablets q.8 p.r.n.  15.  Calcium carbonate 1000 mg q.i.d. p.r.n.  16.  Silvadene 1% cream to affected area daily.  17.  Protonix 40 mg daily.  18.  Alprazolam 1 mg p.o. q.8 p.r.n.  19.  Benadryl 25 mg q.6 p.r.n.  20.  Neurontin 100 mg daily.  21.  Zoloft 100 mg daily.  22.  Plavix 75 mg daily.  23.  Abilify 10 mg p.o. daily.   DISCHARGE INSTRUCTIONS:   1.  Diet is renal diet with 1800 calorie ADA.  2.  Continue physical therapy.  3.  Resume hemodialysis according to your outpatient dialysis  schedule.   TIME SPENT: 40 minutes.   ____________________________ Wylie HailSona A. Allena KatzPatel, MD sap:cs D: 07/05/2013 14:18:00 ET T: 07/05/2013 14:36:29 ET JOB#: [id number]  cc: Reshonda Koerber A. Allena KatzPatel, MD, <Dictator> Leitha SchullerMichael J. Menz, MD Willow OraSONA A Leeandra Ellerson MD ELECTRONICALLY SIGNED 07/06/2013 13:29

## 2014-09-15 NOTE — Consult Note (Signed)
PATIENT NAME:  Stacy MayerOCH, Justise D MR#:  [id number] DATE OF BIRTH:  Apr 18, 1976  DATE OF CONSULTATION:  07/02/2013  REFERRING PHYSICIAN:   CONSULTING PHYSICIAN:  Leitha SchullerMichael J. Kenner Lewan, MD  REASON FOR CONSULTATION: Left leg pain.   HISTORY OF PRESENT ILLNESS: The patient is a 39 year old patient with insulin-dependent diabetes since childhood as well as chronic end-stage renal disease on hemodialysis. She has been having increasing hip pain and thigh pain. She is a poor historian secondary to her medical problems and prior problems with confusion. She does have some element of chronic pain. She has had a prior left distal femur fracture treated with ORIF, which appears healed on her x-rays. She says pain is in the groin on the left. With range of motion of the knee, she has some pain, but with log rolling of the leg she has severe pain in the groin. Her x-rays were reviewed and they show somewhat mottled appearance to the femoral head on the left and right, which could be avascular necrosis and would explain her pain.   CLINICAL IMPRESSION: Severe hip pain, possibly avascular necrosis. Recommend MRI for evaluation of avascular necrosis versus stress fracture.  We will see if we can that done tomorrow and with her minimal ambulatory status, I am not certain if she is really a candidate for joint replacement. We will continue to follow her in the hospital.  ____________________________ Leitha SchullerMichael J. Jalil Lorusso, MD mjm:aw D: 07/02/2013 19:01:35 ET T: 07/03/2013 06:36:56 ET JOB#: [id number]  cc: Leitha SchullerMichael J. Karyl Sharrar, MD, <Dictator> Leitha SchullerMICHAEL J Taiwana Willison MD ELECTRONICALLY SIGNED 07/03/2013 8:21

## 2014-09-16 NOTE — Discharge Summary (Signed)
PATIENT NAME:  Stacy MayerOCH, Campbell D MR#:  [id number] DATE OF BIRTH:  [birth year]  DATE OF ADMISSION:  06/24/2011 DATE OF DISCHARGE:  06/25/2011  DISCHARGE DIAGNOSES:  1. Shortness of breath.  2. Abdominal pain.  3. Depression.  4. End-stage renal disease.  5. Hyperlipidemia.  6. Chronic pain.  7. Non-insulin-dependent diabetes.  8. Gastroesophageal reflux disease.  9. Anemia of chronic disease.  10. History of chest pain with negative Myoview. 11. History of deep vein thrombosis.   DISCHARGE MEDICATIONS: 1. Abilify 10 mg daily.  2. Combivent 18 mcg 2 puffs every 6 hours p.r.n.  3. Iron sulfate 325 mg daily.  4. Lantus 40 units subcutaneous at bedtime.  5. Zofran 4 mg every 6 hours p.r.n.  6. Oxycodone 5 mg every six hours p.r.n.  7. Percocet 10/325 mg four times daily  p.r.n.  8. Plavix 75 mg daily.  9. Protonix 40 mg daily.  10. Renvela 800 mg twice a day. 11. Zoloft 100 mg daily.  12. Sevelamer 0.8 mg three times daily. 13. Simvastatin 40 mg at bedtime.  14. Symbicort 60/25 mg 2 puffs twice a day.   REASON FOR ADMISSION: This is a 39 year old dialysis patient who missed dialysis once this week. She began having some nausea, moderate abdominal pain, and also shortness of breath.   HOSPITAL COURSE:  1. Shortness of breath: She did not have any florid pulmonary edema on chest x-ray, but it was felt that she may have been a little fluid overloaded. This resolved with dialysis.  2. Abdominal pain: She had a negative CT. She does have a history of chronic pain. She had no nausea or vomiting and we discontinued her pain medications.  3. End-stage renal disease: She was dialyzed on her schedule dialysis and [facility].  4. Chronic pain: We continued her current medications.   DISPOSITION: The patient is discharged in stable condition. [facility]   TIME SPENT DISCHARGE: 35 minutes. ____________________________ Gracelyn NurseJohn D.  Johnston, MD jdj:slb D: 06/25/2011 17:18:20 ET T: 06/26/2011 13:51:15 ET JOB#: [id number]  cc: Gracelyn NurseJohn D. Johnston, MD, <Dictator> [facility] Gracelyn NurseJOHN D JOHNSTON MD ELECTRONICALLY SIGNED 06/26/2011 15:38

## 2014-09-16 NOTE — H&P (Signed)
PATIENT NAME:  Stacy Bean, Stacy Bean MR#:  [id number] DATE OF BIRTH:  [birth year]  DATE OF ADMISSION:  06/24/2011  PRIMARY CARE PHYSICIAN:  Dr. Judithann Sheen EMERGENCY ROOM PHYSICIAN: Dr. Ladona Ridgel   CHIEF COMPLAINT: Shortness of breath, abdominal pain.   HISTORY OF PRESENT ILLNESS: The patient is a 39 year old African American female who presents with chief complaint of shortness of breath. Symptoms began acutely last night and have been getting worse. The patient missed hemodialysis treatment once last week. She has been having some nausea and moderate abdominal pain associated with radiation to the right side of her back. She denies any diarrhea. She reports that at times the pain is very severe. She has been taking oxycodone without much relief. She denies any other aggravating or alleviating factors.   PAST MEDICAL HISTORY: 1. Endstage renal disease on hemodialysis. 2. Diabetes. 3. Hypertension. 4. Anemia of chronic disease. 5. Chronic pain syndrome. 6. History of stroke. 7. Menorrhagia. 8. Endometriosis.  9. Right breast mass.  10. History of chest pain, negative Myoview. 11. drug seeking behavior with multiple complaints and multiple admissions. 12. Peripheral vascular disease.  13. History of deep vein thrombosis.  14. MRSA septicemia.  15. Chronic anemia.   ALLERGIES: NSAIDS, tramadol.    CURRENT MEDICATIONS:  1. Abilify 10 mg p.o. daily.  2. Combivent 18 mcg/10 2 puffs q. 6 p.r.n.  3. Ferrous sulfate 325 mg p.o. daily. 4. Lantus 40 units subcutaneously p.m.  5. Zofran 4 mg p.o. q. 6 h. p.r.n. for nausea.  6. Oxycodone 5 mg p.o. q. 6 hours p.r.n. for pain.  7. Percocet 10/325 1 p.o. q.i.d. p.r.n.  8. Plavix 75 mg p.o. daily. 9. Protonix 40 mg p.o. daily.  10. Renvela 800 mg, 2 with meals.  11. Sertraline 100 mg p.o. daily.  12. Sevelamer 0.8 mg p.o. t.i.d.  13. Simvastatin 40 mg p.o. nightly. 14. Symbicort 60 mg/25, 2 puffs b.i.d.   SOCIAL HISTORY: She lives with her mother in  [place]. She smokes 2 to 3 cigarettes per day. She denies alcohol abuse or drug abuse. She requires intermittent wheelchair or walker for ambulation.     FAMILY HISTORY: Positive for coronary artery disease, diabetes, and breast cancer.    REVIEW OF SYSTEMS:  CONSTITUTIONAL: The patient denied any fevers, chills, or night sweats. HEENT: The patient denies any hearing loss, dysphagia, visual problems, or sore throat.  CARDIOVASCULAR: The patient denies any chest pain, orthopnea, or PND. RESPIRATORY: The patient denies any cough, wheezing, or hemoptysis. GI: The patient denies any abdominal pain, hematemesis, hematochezia, or melena. GU: The patient denies any hematuria, dysuria, or frequency. NEUROLOGIC: The patient denies any headache, focal weakness, or seizures. SKIN: The patient denies any lesions or rash. ENDOCRINE: The patient denies any polyuria, polyphagia, or polydipsia. MUSCULOSKELETAL: The patient denies any arthritis, joint effusion, or swelling. HEMATOLOGIC: The patient denies any easy bleeding or bruises. MUSCULOSKELETAL: The patient denies any arthralgias, myalgias, joint swelling or tenderness.     PHYSICAL EXAMINATION:  VITAL SIGNS:  Temperature 97.3, heart rate 88, respirations 24, blood pressure 150/60, and oxygen saturation 97%.   HEENT: Atraumatic, normocephalic. Pupils equal, round, and reactive to light and accommodation. Extraocular movements intact. Sclerae anicteric. Mucous membranes moist.   NECK: Supple. No organomegaly.   CARDIOVASCULAR: S1, S2. Regular rate, rhythm. No gallops. No thrills. No murmurs.   RESPIRATORY:  Lungs are clear to auscultation. No rales, no rhonchi, no wheezes, no bronchial breath sounds.   GI: Abdomen is soft, nontender, nondistended. Normal bowel  sounds. No hepatosplenomegaly.   GU: There is no hematuria or masses noted.   SKIN: No lesions, no rash.   ENDOCRINE: No masses, no thyromegaly.   LYMPH: No lymphadenopathy or nodes  palpable.  NEURO:  Cranial nerves II-XII grossly intact. Motor strength is five out of five in bilateral upper and lower extremities.  Sensation is within normal limits. No focal neurological deficit noted on  examination.   MUSCULOSKELETAL: No arthritis, joint effusion, or swelling.   HEMATOLOGIC: No ecchymosis, no bleeding. No petechia.   EXTREMITIES: No cyanosis, no clubbing, no edema. 2+ pedal pulses noted bilaterally.    EKG: Normal sinus rhythm. 91 beats per minute. No acute ST or T wave changes.   LABORATORY DATA: WBC count 7300, hemoglobin 13.9, hematocrit 42.4, platelet count 279, MCV 95, glucose 113, BUN 34, creatinine 0.88, sodium 139, potassium 4.5. Chloride 95, CO2 31, estimated GFR 6. Troponin less than 0.02. Total CK 161, CK-MB 1.1, BNP 394. ABG: pH 7.36, pCO2 53, pO2 84, bicarbonate is 29.9.     ASSESSMENT AND PLAN:  1. The patient is a 39 year old female who presents with chief complaint of shortness of breath and fluid overload.  The patient has a history of endstage renal disease. We will admit to telemetry. Nephrology consultation.  2. Abdominal pain. Start morphine for pain control. Check CT of the abdomen and pelvis. Diabetes. Accu-Cheks and insulin sliding scale. Continue Lantus.  3. Depression. Continue Abilify, Sertraline.  4. Hyperlipidemia. Continue simvastatin. 5. Iron deficiency anemia. Continue iron sulfate.   6. renal medications Continue Renvela, sevelamer. 7. Chronic pain. Continue oxycodone, Percocet.  8. Abdominal pain. Start morphine. Check CT of the abdomen and pelvis. 9. Gastroesophageal reflux disease.  Continue Protonix. 10. History of stroke: Continue Plavix.    ____________________________ Donia AstJignesh S. Avaleen Brownley, MD jsp:bjt D: 06/24/2011 04:33:03 ET T: 06/24/2011 07:18:23 ET JOB#: [id number]  cc: Donia AstJignesh S. Clariza Sickman, MD, <Dictator> Duane LopeJeffrey D. Judithann SheenSparks, MD Donia AstJIGNESH S Ranell Skibinski MD ELECTRONICALLY SIGNED 06/24/2011 22:02

## 2018-11-23 DEATH — deceased
